# Patient Record
Sex: Female | Born: 1965 | Race: White | Hispanic: No | Marital: Married
Health system: Midwestern US, Academic
[De-identification: ages and names within clinical notes are randomized; demographics above are authoritative.]

---

## 2016-12-05 MED ORDER — SODIUM CHLORIDE 0.9 % IV SOLP
INTRAVENOUS | 0 refills | Status: CN
Start: 2016-12-05 — End: ?

## 2016-12-05 MED ORDER — PEG 3350-ELECTROLYTES-VIT C 100-7.5-2.691 GRAM PO PWPK
1 | ORAL | 0 refills | 1.00000 days | Status: DC
Start: 2016-12-05 — End: 2016-12-06

## 2016-12-06 DIAGNOSIS — K7460 Unspecified cirrhosis of liver: Secondary | ICD-10-CM

## 2016-12-06 MED ORDER — PEG-ELECTROLYTE SOLN 420 GRAM PO SOLR
4 L | Freq: Once | ORAL | 0 refills | Status: AC
Start: 2016-12-06 — End: ?

## 2016-12-09 MED ORDER — PEG 3350-ELECTROLYTES 236-22.74-6.74 -5.86 GRAM PO SOLR
0 refills | 1.00000 days | Status: DC
Start: 2016-12-09 — End: 2017-01-06

## 2016-12-24 MED ORDER — CHOLECALCIFEROL (VITAMIN D3) 50,000 UNIT PO CAP
50000 [IU] | ORAL_CAPSULE | ORAL | 2 refills | 84.00000 days | Status: DC
Start: 2016-12-24 — End: 2017-04-18

## 2017-01-03 ENCOUNTER — Encounter: Admit: 2017-01-03 | Discharge: 2017-01-03 | Payer: MEDICARE

## 2017-01-03 NOTE — Telephone Encounter
Emailed pre procedure instructions to pt.  LM for her to call back if she has any questions.

## 2017-01-03 NOTE — Telephone Encounter
Please put EGD/colon instructions into MyChart for patient.

## 2017-01-06 ENCOUNTER — Encounter: Admit: 2017-01-06 | Discharge: 2017-01-06 | Payer: MEDICARE

## 2017-01-06 MED ORDER — PEG 3350-ELECTROLYTES 236-22.74-6.74 -5.86 GRAM PO SOLR
0 refills | 1.00000 days | Status: AC
Start: 2017-01-06 — End: 2017-03-18

## 2017-01-06 NOTE — Progress Notes
Script for [employer] sent into [employer] on file.

## 2017-01-10 ENCOUNTER — Ambulatory Visit: Admit: 2017-01-10 | Discharge: 2017-01-10 | Payer: MEDICARE

## 2017-01-10 ENCOUNTER — Encounter: Admit: 2017-01-10 | Discharge: 2017-01-10 | Payer: MEDICARE

## 2017-01-10 DIAGNOSIS — D649 Anemia, unspecified: ICD-10-CM

## 2017-01-10 DIAGNOSIS — K210 Gastro-esophageal reflux disease with esophagitis: ICD-10-CM

## 2017-01-10 DIAGNOSIS — E079 Disorder of thyroid, unspecified: ICD-10-CM

## 2017-01-10 DIAGNOSIS — K317 Polyp of stomach and duodenum: ICD-10-CM

## 2017-01-10 DIAGNOSIS — F99 Mental disorder, not otherwise specified: ICD-10-CM

## 2017-01-10 DIAGNOSIS — K921 Melena: ICD-10-CM

## 2017-01-10 DIAGNOSIS — I8510 Secondary esophageal varices without bleeding: ICD-10-CM

## 2017-01-10 DIAGNOSIS — K766 Portal hypertension: ICD-10-CM

## 2017-01-10 DIAGNOSIS — K7460 Unspecified cirrhosis of liver: ICD-10-CM

## 2017-01-10 DIAGNOSIS — Z87891 Personal history of nicotine dependence: ICD-10-CM

## 2017-01-10 DIAGNOSIS — K644 Residual hemorrhoidal skin tags: ICD-10-CM

## 2017-01-10 DIAGNOSIS — K209 Esophagitis, unspecified: Principal | ICD-10-CM

## 2017-01-10 DIAGNOSIS — R1310 Dysphagia, unspecified: ICD-10-CM

## 2017-01-10 DIAGNOSIS — Z7984 Long term (current) use of oral hypoglycemic drugs: ICD-10-CM

## 2017-01-10 DIAGNOSIS — Z794 Long term (current) use of insulin: ICD-10-CM

## 2017-01-10 DIAGNOSIS — J45909 Unspecified asthma, uncomplicated: ICD-10-CM

## 2017-01-10 DIAGNOSIS — K3189 Other diseases of stomach and duodenum: ICD-10-CM

## 2017-01-10 DIAGNOSIS — D123 Benign neoplasm of transverse colon: Principal | ICD-10-CM

## 2017-01-10 MED ORDER — KETAMINE 10 MG/ML IJ SOLN
0 refills | Status: DC
Start: 2017-01-10 — End: 2017-01-10
  Administered 2017-01-10 (×3): 20 mg via INTRAVENOUS

## 2017-01-10 MED ORDER — LACTATED RINGERS IV SOLP
0 refills | Status: DC
Start: 2017-01-10 — End: 2017-01-10
  Administered 2017-01-10: 14:00:00 via INTRAVENOUS

## 2017-01-10 MED ORDER — PANTOPRAZOLE 40 MG PO TBEC
40 mg | ORAL_TABLET | Freq: Two times a day (BID) | ORAL | 3 refills | 90.00000 days | Status: DC
Start: 2017-01-10 — End: 2017-10-09

## 2017-01-10 MED ORDER — FENTANYL CITRATE (PF) 50 MCG/ML IJ SOLN
0 refills | Status: DC
Start: 2017-01-10 — End: 2017-01-10
  Administered 2017-01-10 (×5): 50 ug via INTRAVENOUS

## 2017-01-10 MED ORDER — MIDAZOLAM 1 MG/ML IJ SOLN
0 refills | Status: DC
Start: 2017-01-10 — End: 2017-01-10
  Administered 2017-01-10 (×2): 2 mg via INTRAVENOUS

## 2017-01-10 MED ORDER — LIDOCAINE (PF) 200 MG/10 ML (2 %) IJ SYRG
0 refills | Status: DC
Start: 2017-01-10 — End: 2017-01-10
  Administered 2017-01-10: 15:00:00 50 mg via INTRAVENOUS

## 2017-01-10 MED ORDER — INSULIN ASPART 100 UNIT/ML SC FLEXPEN
10 [IU] | Freq: Once | SUBCUTANEOUS | 0 refills | Status: CP
Start: 2017-01-10 — End: ?
  Administered 2017-01-10: 13:00:00 10 [IU] via SUBCUTANEOUS

## 2017-01-10 NOTE — Anesthesia Post-Procedure Evaluation
Post-Anesthesia Evaluation    Name: Nija Koopman      MRN: [id number]     DOB: October 16, 1965     Age: 51 y.o.     Sex: female   __________________________________________________________________________     Procedure Date: 01/10/2017  Procedure: Procedure(s):  ESOPHAGOGASTRODUODENOSCOPY  COLONOSCOPY  ESOPHAGOGASTRODUODENOSCOPY EXCISION LESION  COLONOSCOPY EXCISION LESION      Surgeon: Surgeon(s):  Dawna Part, MD    Post-Anesthesia Vitals  BP: (114-129)/(56-66)   Temp:  [37.1 ???C (98.8 ???F)]   Pulse:  [81-86]   Respirations:  [16 PER MINUTE]   SpO2:  [96 %-97 %]   O2 Delivery: None (Room Air) (06/08 1031)  SpO2 Pulse:  [80-84]        Post Anesthesia Evaluation Note    Evaluation location: Pre/Post  Patient participation: recovered; patient participated in evaluation  Level of consciousness: alert    Pain score: 0  Pain management: adequate    Hydration: normovolemia  Temperature: 36.0???C - 38.4???C  Airway patency: adequate    Perioperative Events  Perioperative events:  no       Post-op nausea and vomiting: no PONV    Postoperative Status  Cardiovascular status: hemodynamically stable  Respiratory status: spontaneous ventilation  Additional comments: Received [facility] records while patient in procedure.  Dicussed my interpretation with patient and husband.  It appears that ARF was multifactorial with rhabdomyolysis, overdiuresis with lasix and sepsis. Also had postoperative respiratory failure.  Propofol infusion syndrome was on the differential diagnosis for ARF, but patient was on it less than 1 day before it was discontinued and other causes certainly contributed to renal failure.  All questions answered.  Advised them that propofol is not necessarily an allergy and may not have even contributed to her postop course.  She is happy with endoscopic procedures under versed and fentanyl sedation and will consider whether she is willing to try propofol if needed in the future.  Records submitted to secretary for scanning into [facility] system.       Perioperative Events  Perioperative Event: No  Emergency Case Activation: No

## 2017-01-13 ENCOUNTER — Encounter: Admit: 2017-01-13 | Discharge: 2017-01-13 | Payer: MEDICARE

## 2017-01-13 DIAGNOSIS — E079 Disorder of thyroid, unspecified: ICD-10-CM

## 2017-01-13 DIAGNOSIS — J45909 Unspecified asthma, uncomplicated: Principal | ICD-10-CM

## 2017-01-13 DIAGNOSIS — F99 Mental disorder, not otherwise specified: ICD-10-CM

## 2017-01-14 DIAGNOSIS — I8500 Esophageal varices without bleeding: ICD-10-CM

## 2017-01-16 ENCOUNTER — Encounter: Admit: 2017-01-16 | Discharge: 2017-01-16 | Payer: MEDICARE

## 2017-01-16 DIAGNOSIS — K209 Esophagitis, unspecified: Principal | ICD-10-CM

## 2017-01-16 MED ORDER — SODIUM CHLORIDE 0.9 % IV SOLP
INTRAVENOUS | 0 refills | Status: CN
Start: 2017-01-16 — End: ?

## 2017-01-16 NOTE — Telephone Encounter
Reviewed EGD and colonoscopy biopsy results with pt.  Orders entered for EGD to be scheduled in 12 weeks per Dr. Tanna Furry recommendations. Informed her she will be due for colonoscopy in 5 years.  Pt verbalized understanding.

## 2017-01-27 ENCOUNTER — Encounter: Admit: 2017-01-27 | Discharge: 2017-01-27 | Payer: MEDICARE

## 2017-01-27 NOTE — Telephone Encounter
Returned call to pt.  She reports persistent nausea that seems to be better today but she did have nausea yesterday.   She currently alternates zofran and phenergan when she has these symptoms.  She also reports intermittent right upper quadrant pain.  She has recently been to the ER and pancreatitis has been ruled out.  She is looking for a cause of the pain and nausea.  Will discuss with Dr. Jeannine Kitten and return call to pt.

## 2017-01-27 NOTE — Telephone Encounter
Pt lvm requesting to speak with NC regarding severe nausea.

## 2017-01-28 NOTE — Telephone Encounter
Dr. Jeannine Kitten called pt and spoke with her about her symptoms.  Her diabetes is not well controlled at this time.  She needs to work on lowering blood sugars.  No changes were made.

## 2017-01-28 NOTE — Telephone Encounter
Call from husband stating the Alexis Mcconnell is still suffering from persistent nausea.  Please return his call.

## 2017-02-10 ENCOUNTER — Encounter: Admit: 2017-02-10 | Discharge: 2017-02-10 | Payer: MEDICARE

## 2017-02-10 NOTE — Telephone Encounter
Please return a call to [facility] regarding dates needed for some upcoming tests.  Wants to discuss with you.

## 2017-02-10 NOTE — Telephone Encounter
Returned call to pt. No answer LM for her to call back.

## 2017-02-11 NOTE — Telephone Encounter
Spoke with pt today.  She was wanting to know when GI would call her to schedule follow up EGD.  Informed her that they should be calling soon to schedule that appointment.  She also inquired about a date for her ultrasound.  Informed her that she is not due until September for abdominal imaging.  We will send the order to [facility] to be completed there at that time.  Pt verbalized understanding.

## 2017-02-12 NOTE — Telephone Encounter
Pt PCP called informing us that pt recent potassium was 6.2.  They have instructed pt to hold spironolactone for 1 week and will recheck in 1 week.  They will send [place] the results.

## 2017-03-07 ENCOUNTER — Encounter: Admit: 2017-03-07 | Discharge: 2017-03-07 | Payer: MEDICARE

## 2017-03-07 DIAGNOSIS — E875 Hyperkalemia: Principal | ICD-10-CM

## 2017-03-07 NOTE — Telephone Encounter
CMP order faxed to PCP office to recheck potassium level.

## 2017-03-07 NOTE — Telephone Encounter
Please fax new lab orders to Dr. Barbera Setters office.

## 2017-03-14 ENCOUNTER — Encounter: Admit: 2017-03-14 | Discharge: 2017-03-14 | Payer: MEDICARE

## 2017-03-14 DIAGNOSIS — E875 Hyperkalemia: Principal | ICD-10-CM

## 2017-03-14 LAB — COMPREHENSIVE METABOLIC PANEL
Lab: 0.5 % — ABNORMAL HIGH (ref 11–15)
Lab: 1.2 10*3/uL — ABNORMAL HIGH (ref 1.0–4.8)
Lab: 10 10*3/uL (ref >60)
Lab: 102 % — ABNORMAL LOW (ref 24–44)
Lab: 118 % — ABNORMAL HIGH (ref >60)
Lab: 13
Lab: 13 10*3/uL — ABNORMAL LOW (ref 0–0.20)
Lab: 140 FL (ref 7–11)
Lab: 2.8 pg — ABNORMAL LOW (ref 26–34)
Lab: 223 10*3/uL — ABNORMAL HIGH (ref 150–400)
Lab: 3.3 % — ABNORMAL LOW (ref 41–77)
Lab: 31 % — ABNORMAL HIGH (ref 4–12)
Lab: 47 10*3/uL — ABNORMAL LOW (ref 0–0.80)
Lab: 57 10*3/uL — ABNORMAL LOW (ref 0–0.45)
Lab: 6.3 FL — ABNORMAL LOW (ref 80–100)
Lab: 7 % (ref 0–5)
Lab: 8.1 g/dL — ABNORMAL LOW (ref 32.0–36.0)

## 2017-03-17 ENCOUNTER — Encounter: Admit: 2017-03-17 | Discharge: 2017-03-17 | Payer: MEDICARE

## 2017-03-17 LAB — URINALYSIS DIPSTICK
Lab: NEGATIVE
Lab: NEGATIVE
Lab: NEGATIVE
Lab: NEGATIVE
Lab: NEGATIVE
Lab: NEGATIVE
Lab: NEGATIVE
Lab: NEGATIVE

## 2017-03-17 LAB — URINALYSIS, MICROSCOPIC

## 2017-03-17 MED ORDER — PANTOPRAZOLE 40 MG IV SOLR
40 mg | Freq: Once | INTRAVENOUS | 0 refills | Status: CP
Start: 2017-03-17 — End: ?
  Administered 2017-03-18: 04:00:00 40 mg via INTRAVENOUS

## 2017-03-17 MED ORDER — PANTOPRAZOLE 40 MG IV SOLR
40 mg | Freq: Every day | INTRAVENOUS | 0 refills | Status: DC
Start: 2017-03-17 — End: 2017-03-18

## 2017-03-17 MED ORDER — ONDANSETRON HCL (PF) 4 MG/2 ML IJ SOLN
4 mg | Freq: Once | INTRAVENOUS | 0 refills | Status: CP
Start: 2017-03-17 — End: ?
  Administered 2017-03-18: 04:00:00 4 mg via INTRAVENOUS

## 2017-03-17 MED ORDER — OCTREOTIDE IV DRIP
50 ug/h | INTRAVENOUS | 0 refills | Status: DC
Start: 2017-03-17 — End: 2017-03-18
  Administered 2017-03-18 (×2): 50 ug/h via INTRAVENOUS

## 2017-03-17 MED ORDER — FENTANYL CITRATE (PF) 50 MCG/ML IJ SOLN
50 ug | Freq: Once | INTRAVENOUS | 0 refills | Status: CP
Start: 2017-03-17 — End: ?
  Administered 2017-03-18: 04:00:00 50 ug via INTRAVENOUS

## 2017-03-17 MED ORDER — CEFTRIAXONE INJ 2GM IVP
2 g | Freq: Once | INTRAVENOUS | 0 refills | Status: CP
Start: 2017-03-17 — End: ?
  Administered 2017-03-18: 05:00:00 2 g via INTRAVENOUS

## 2017-03-17 MED ORDER — OCTREOTIDE (SANDOSTATIN) BOLUS FOR CONTINUOUS INFUSION
50 ug | Freq: Once | INTRAVENOUS | 0 refills | Status: CP
Start: 2017-03-17 — End: ?

## 2017-03-17 NOTE — Telephone Encounter
Dr. Jeannine Kitten communicating with pt regarding this issue.

## 2017-03-17 NOTE — Telephone Encounter
Pt lvm to report rectal bleeding. Please advise

## 2017-03-18 ENCOUNTER — Encounter: Admit: 2017-03-18 | Discharge: 2017-03-18 | Payer: MEDICARE

## 2017-03-18 ENCOUNTER — Emergency Department: Admit: 2017-03-18 | Discharge: 2017-03-18 | Payer: MEDICARE

## 2017-03-18 DIAGNOSIS — J45909 Unspecified asthma, uncomplicated: Principal | ICD-10-CM

## 2017-03-18 DIAGNOSIS — K7460 Unspecified cirrhosis of liver: ICD-10-CM

## 2017-03-18 DIAGNOSIS — F99 Mental disorder, not otherwise specified: ICD-10-CM

## 2017-03-18 DIAGNOSIS — E079 Disorder of thyroid, unspecified: ICD-10-CM

## 2017-03-18 LAB — COMPREHENSIVE METABOLIC PANEL
Lab: 0.6 mg/dL (ref 0.3–1.2)
Lab: 1.3 mg/dL — ABNORMAL HIGH (ref 0.4–1.00)
Lab: 12 mg/dL (ref 7–25)
Lab: 142 MMOL/L — ABNORMAL LOW (ref 137–147)
Lab: 146 mg/dL — ABNORMAL HIGH (ref 70–100)
Lab: 17 U/L (ref 7–40)
Lab: 197 U/L — ABNORMAL HIGH (ref 25–110)
Lab: 3.4 g/dL — ABNORMAL LOW (ref 3.5–5.0)
Lab: 3.6 MMOL/L — ABNORMAL LOW (ref 3.5–5.1)
Lab: 30 MMOL/L (ref 21–30)
Lab: 6.4 g/dL (ref 6.0–8.0)
Lab: 8 U/L (ref 7–56)
Lab: 8.8 mg/dL — ABNORMAL LOW (ref 8.5–10.6)

## 2017-03-18 LAB — BASIC METABOLIC PANEL
Lab: 1.2 mg/dL — ABNORMAL HIGH (ref 0.4–1.00)
Lab: 10 pg (ref 3–12)
Lab: 102 MMOL/L — ABNORMAL LOW (ref 98–110)
Lab: 11 mg/dL — ABNORMAL HIGH (ref 7–25)
Lab: 141 MMOL/L — ABNORMAL LOW (ref 137–147)
Lab: 159 mg/dL — ABNORMAL HIGH (ref 70–100)
Lab: 29 MMOL/L (ref 21–30)
Lab: 3.9 MMOL/L — ABNORMAL LOW (ref 3.5–5.1)
Lab: 44 mL/min — ABNORMAL LOW (ref >60)
Lab: 53 mL/min — ABNORMAL LOW (ref >60)
Lab: 8.6 mg/dL (ref 8.5–10.6)

## 2017-03-18 LAB — CBC AND DIFF: Lab: 7.9 10*3/uL (ref 4.5–11.0)

## 2017-03-18 LAB — CBC
Lab: 8.1 10*3/uL (ref 4.5–11.0)
Lab: 2.6 M/UL — ABNORMAL LOW (ref 4.0–5.0)
Lab: 7.9 10*3/uL (ref 4.5–11.0)
Lab: 8.1 g/dL — ABNORMAL LOW (ref 12.0–15.0)

## 2017-03-18 LAB — POC LACTATE: Lab: 1.4 MMOL/L (ref 0.5–2.0)

## 2017-03-18 LAB — POC GLUCOSE
Lab: 192 mg/dL — ABNORMAL HIGH (ref 70–100)
Lab: 257 mg/dL — ABNORMAL HIGH (ref 70–100)
Lab: 314 mg/dL — ABNORMAL HIGH (ref 70–100)
Lab: 286 mg/dL — ABNORMAL HIGH (ref 70–100)

## 2017-03-18 LAB — LIPASE: Lab: <3 U/L — ABNORMAL LOW (ref 11–82)

## 2017-03-18 LAB — PROTIME INR (PT): Lab: 1.6 M/UL — ABNORMAL HIGH (ref 0.8–1.2)

## 2017-03-18 MED ORDER — FUROSEMIDE 40 MG PO TAB
40 mg | Freq: Two times a day (BID) | ORAL | 0 refills | Status: DC
Start: 2017-03-18 — End: 2017-03-18
  Administered 2017-03-18: 22:00:00 40 mg via ORAL

## 2017-03-18 MED ORDER — INSULIN GLARGINE 100 UNIT/ML (3 ML) SC INJ PEN
50 [IU] | Freq: Every evening | SUBCUTANEOUS | 0 refills | Status: DC
Start: 2017-03-18 — End: 2017-03-21
  Administered 2017-03-19: 03:00:00 50 [IU] via SUBCUTANEOUS

## 2017-03-18 MED ORDER — ACETAMINOPHEN 325 MG PO TAB
325 mg | ORAL | 0 refills | Status: DC | PRN
Start: 2017-03-18 — End: 2017-03-21
  Administered 2017-03-18 – 2017-03-19 (×4): 325 mg via ORAL

## 2017-03-18 MED ORDER — HEMORRHOIDAL SUPP (PHENYLEPHRINE 0.25 %)
1 | Freq: Every day | RECTAL | 0 refills | Status: DC
Start: 2017-03-18 — End: 2017-03-18
  Administered 2017-03-18: 23:00:00 1 via RECTAL

## 2017-03-18 MED ORDER — INSULIN ASPART 100 UNIT/ML SC FLEXPEN
0-28 [IU] | Freq: Before meals | SUBCUTANEOUS | 0 refills | Status: DC
Start: 2017-03-18 — End: 2017-03-21

## 2017-03-18 MED ORDER — VITAMIN A 10,000 UNIT PO CAP
10000 [IU] | Freq: Every day | ORAL | 0 refills | Status: DC
Start: 2017-03-18 — End: 2017-03-21
  Administered 2017-03-18 – 2017-03-20 (×3): 10000 [IU] via ORAL

## 2017-03-18 MED ORDER — METOCLOPRAMIDE HCL 10 MG PO TAB
10 mg | Freq: Two times a day (BID) | ORAL | 0 refills | Status: DC
Start: 2017-03-18 — End: 2017-03-21
  Administered 2017-03-18 – 2017-03-20 (×4): 10 mg via ORAL

## 2017-03-18 MED ORDER — PANTOPRAZOLE 40 MG PO TBEC
40 mg | Freq: Two times a day (BID) | ORAL | 0 refills | Status: DC
Start: 2017-03-18 — End: 2017-03-21
  Administered 2017-03-18 – 2017-03-20 (×5): 40 mg via ORAL

## 2017-03-18 MED ORDER — GEMFIBROZIL 600 MG PO TAB
600 mg | Freq: Two times a day (BID) | ORAL | 0 refills | Status: DC
Start: 2017-03-18 — End: 2017-03-21
  Administered 2017-03-18 – 2017-03-20 (×5): 600 mg via ORAL

## 2017-03-18 MED ORDER — GABAPENTIN 300 MG PO CAP
600 mg | Freq: Three times a day (TID) | ORAL | 0 refills | Status: DC
Start: 2017-03-18 — End: 2017-03-21
  Administered 2017-03-18 – 2017-03-20 (×7): 600 mg via ORAL

## 2017-03-18 MED ORDER — INSULIN ASPART 100 UNIT/ML SC FLEXPEN
5 [IU] | Freq: Three times a day (TID) | SUBCUTANEOUS | 0 refills | Status: DC
Start: 2017-03-18 — End: 2017-03-18
  Administered 2017-03-18: 15:00:00 5 [IU] via SUBCUTANEOUS

## 2017-03-18 MED ORDER — FLUOXETINE 20 MG PO CAP
40 mg | Freq: Every day | ORAL | 0 refills | Status: DC
Start: 2017-03-18 — End: 2017-03-21
  Administered 2017-03-18 – 2017-03-20 (×3): 40 mg via ORAL

## 2017-03-18 MED ORDER — LEVOTHYROXINE 200 MCG PO TAB
200 ug | Freq: Every day | ORAL | 0 refills | Status: DC
Start: 2017-03-18 — End: 2017-03-18

## 2017-03-18 MED ORDER — ONDANSETRON HCL (PF) 4 MG/2 ML IJ SOLN
4 mg | Freq: Once | INTRAVENOUS | 0 refills | Status: CP
Start: 2017-03-18 — End: ?
  Administered 2017-03-18: 07:00:00 4 mg via INTRAVENOUS

## 2017-03-18 MED ORDER — FUROSEMIDE 10 MG/ML IJ SOLN
40 mg | INTRAVENOUS | 0 refills | Status: DC
Start: 2017-03-18 — End: 2017-03-19
  Administered 2017-03-19 (×3): 40 mg via INTRAVENOUS

## 2017-03-18 MED ORDER — VALACYCLOVIR 500 MG PO TAB
500 mg | Freq: Every day | ORAL | 0 refills | Status: DC
Start: 2017-03-18 — End: 2017-03-21
  Administered 2017-03-18 – 2017-03-20 (×3): 500 mg via ORAL

## 2017-03-18 MED ORDER — ONDANSETRON HCL 4 MG PO TAB
4 mg | ORAL | 0 refills | Status: DC | PRN
Start: 2017-03-18 — End: 2017-03-21
  Administered 2017-03-18 – 2017-03-20 (×3): 4 mg via ORAL

## 2017-03-18 MED ORDER — LACTULOSE 10 GRAM/15 ML PO SOLN
30 g | Freq: Four times a day (QID) | ORAL | 0 refills | Status: DC
Start: 2017-03-18 — End: 2017-03-21
  Administered 2017-03-18 – 2017-03-20 (×7): 30 g via ORAL

## 2017-03-18 MED ORDER — OXYCODONE 5 MG PO TAB
5 mg | ORAL | 0 refills | Status: DC | PRN
Start: 2017-03-18 — End: 2017-03-21
  Administered 2017-03-18 – 2017-03-20 (×8): 5 mg via ORAL

## 2017-03-18 MED ORDER — FUROSEMIDE 10 MG/ML IJ SOLN
40 mg | Freq: Once | INTRAVENOUS | 0 refills | Status: CP
Start: 2017-03-18 — End: ?
  Administered 2017-03-18: 10:00:00 40 mg via INTRAVENOUS

## 2017-03-18 MED ORDER — RIFAXIMIN 550 MG PO TAB
550 mg | Freq: Two times a day (BID) | ORAL | 0 refills | Status: DC
Start: 2017-03-18 — End: 2017-03-21
  Administered 2017-03-18 – 2017-03-20 (×5): 550 mg via ORAL

## 2017-03-18 MED ORDER — FUROSEMIDE 10 MG/ML IJ SOLN
20 mg | Freq: Once | INTRAVENOUS | 0 refills | Status: CP
Start: 2017-03-18 — End: ?
  Administered 2017-03-19: 20 mg via INTRAVENOUS

## 2017-03-18 MED ORDER — INSULIN ASPART 100 UNIT/ML SC FLEXPEN
0-7 [IU] | Freq: Before meals | SUBCUTANEOUS | 0 refills | Status: DC
Start: 2017-03-18 — End: 2017-03-18

## 2017-03-18 MED ORDER — SPIRONOLACTONE 100 MG PO TAB
100 mg | Freq: Every day | ORAL | 0 refills | Status: DC
Start: 2017-03-18 — End: 2017-03-21
  Administered 2017-03-19 – 2017-03-20 (×3): 100 mg via ORAL

## 2017-03-18 MED ORDER — HEMORRHOIDAL SUPP (PHENYLEPHRINE 0.25 %)
1 | Freq: Two times a day (BID) | RECTAL | 0 refills | Status: DC
Start: 2017-03-18 — End: 2017-03-21
  Administered 2017-03-19 – 2017-03-20 (×3): 1 via RECTAL

## 2017-03-18 MED ORDER — IMS MIXTURE TEMPLATE
175 ug | Freq: Every day | ORAL | 0 refills | Status: DC
Start: 2017-03-18 — End: 2017-03-21
  Administered 2017-03-19 – 2017-03-20 (×4): 175 ug via ORAL

## 2017-03-18 NOTE — ED Notes
Pt presents to the ED via POV w/ c/o rectal bleeding x Saturday around 2pm. Pt reports bright red blood per rectum. Pt denies recent constipation. 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 Pt reports hx of hemorrhoids but denies any at this time. Pt does endorse L sided abdominal pain and nausea. Pt reports low grade fevers x months. Pt is AO x 4. Airway intact, breathing non-labored, pulses palpable. Pt placed on monitor. Bed in low position, side-rails raised, call light in reach.     Belongings: Pt reports SO at bedside responsible for all belongings.

## 2017-03-18 NOTE — Case Management (ED)
Case Management Admission Assessment    NAME:Alexis Mcconnell                          MRN: [id number]             DOB:[birth year]          AGE: 51 y.o.  ADMISSION DATE: 03/17/2017             DAYS ADMITTED: LOS: 0 days      Today???s Date: 03/18/2017    Source of Information: Spoke with pt and with her permission her husband, Sam who is present tin the room,  concerning needs and care.  Explained role and purpose of NCM/SW in pt's care. Pt/husband  relate understanding and agreement.      Plan  Plan: CM Assessment, Assist PRN with SW/NCM Services, Discharge Planning for Home Anticipated    Patient Address/Phone  [address]  [place] [address]  [phone] (home)     Emergency Contact  Extended Emergency Contact Information  Primary Emergency Contact: Adeleine, Gerads States  Home Phone: [phone]  Relation: Spouse  Secondary Emergency Contact: Ebony Cargo [place]  Home Phone: [phone]  Relation: Mother    Healthcare Directive  Healthcare Directive: No, patient does not have a healthcare directive  Would patient like to fill out a  new) Healthcare Directive?: No, patient declined      Transportation  Does the patient need discharge transport arranged?: No  Transportation Name, Phone and Availability #1: husband can provide    Expected Discharge Date  Expected Discharge Date: 03/20/17    Living Situation Prior to Admission  ? Living Arrangements  Type of Residence: Home, with Home Health or other assistance  Living Arrangements: Spouse/significant other  Can patient live on one level if needed?:  (has 1 step to bathroom)  Does residence have entry and/or side stairs?: Yes (6)  Assistance needed prior to admit or anticipated on discharge: Yes  Who provides assistance or could if needed?: husband helps with bathing  ? Level of Function   Prior level of function: Needs assist with ADLs  Which ADLs require assistance?: bathing   ? Cognitive Abilities Cognitive Abilities: Alert and Oriented, Engages in problem solving and planning, Participates in decision making    Financial Resources  ? Coverage  Primary Insurance: Medicare  Secondary Insurance: Medicare Supplement  Additional Coverage: RX (not able to afford some medications- has co pay assistance for Rifaximin. not able to afford Linzess and doesn't have.-- years ago wasn't able to pay the $20 for Wellbutrin. )    ? Source of Income   Source Of Income: SSDI  ? Financial Assistance Needed?  na    Psychosocial Needs  ? Mental Health  Mental Health History: Yes  Agency name: depression- takes Prozac  Mental Health Provider: had an appointment to see a biblical counselor in [place] today   ? Substance Use History  Substance Use History Screen: No  ? Other  ? na  Current/Previous Services  ? PCP  [facility], [phone], [phone]  ? Pharmacy    [facility] [address], [place] - [address] [facility]  [address] [place] [address]  Phone: [phone] Fax: [phone]    ? Durable Medical Equipment   Durable Medical Equipment at home: Wheelchair (manual), CPAP- has at hospital, from [place], Rollator, 2 different canes one large and one small, usually  uses a cane.  Shower chair, walker    ? Home Health  Receiving home health: In the past  Agency name: [facility]   Would patient use this agency again?: No    ? Hemodialysis or Peritoneal Dialysis     ? Tube/Enteral Feeds     ? Infusion     ? Private Duty     ? Home and [employer]     ? [facility]     ? Hospice     ? Outpatient Therapy     ? Skilled Nursing Facility/Nursing Home  SNF: In the past  Name of Facility: [facility] on [address] in [place]. Jomarie Longs  Would patient return for future services?: No    ? Inpatient Rehab  IPR: No  ? Long-Term Acute Care Hospital  LTACH: In the past  When did patient receive care?: 2010  Name of Facility: [facility]   ? Acute Hospital Stay  Acute Hospital Stay: In the past Was patient's stay within the last 30 days?: No    L Romonda Parker, RN MSN ONC  Nurse Case Manager  Pg [phone]  X- [phone]

## 2017-03-18 NOTE — Progress Notes
Patient arrived to room # [id number]*) via bed accompanied by transport. Patient transferred to the bed with assistance. Bedside safety checks completed. Initial patient assessment completed, refer to flowsheet for details. Admission skin assessment completed by: Daisy Blossom and [place] Prude     Pressure Injury Present on Hospital Admission (within 24 hours): No    1. Occiput: No  2. Ear: No  3. Scapula: No  4. Spinous Process: No  5. Shoulder: No  6. Elbow: No  7. Iliac Crest: No  8. Sacrum/Coccyx: No  9. Ischial Tuberosity: No  10. Trochanter: No  11. Knee: No  12. Malleolus: No  13. Heel: No  14. Toes: No  15. Assessed for device associated injury Yes  16. Nursing Nutrition Assessment Completed Yes    See Doc Flowsheet for additional wound details.     INTERVENTIONS:

## 2017-03-18 NOTE — ED Notes
[id number] @ 0237. Please call Mendel Ryder, RN at [phone].

## 2017-03-18 NOTE — Progress Notes
I have reviewed the notes, assessment, and/or procedures performed by Young Hyun, RN and concur with her/his documentation unless otherwise noted.

## 2017-03-18 NOTE — Care Coordination-Inpatient
From N4.

## 2017-03-18 NOTE — Progress Notes
Patient allergy band in place.Pt A&Ox4. Pt ID band verified with patient.  Profile completed, Fall risk in place, care plan/education updated,  call light within reach

## 2017-03-18 NOTE — Progress Notes
Reason for Visit: Patient request    Faith/Religion: [employer]    Worries/Concerns/Struggles: Patient asked which church chaplain attended, then stated she was [employer] [facility] and her pastor is too far away to visit. Patient stated her frustration with communication with one of the doctors. Patient stated she was told one thing in the ED and another at bedside in this unit. Patient stated spouse is exhausted (two hours sleep) and is remaining at patient's bedside until patient receives a consult from liver specialist. Patient requested prayer for this situation.    Support System:  Spouse    Interventions/Plan: Chaplain provided reflective and compassionate listening. Chaplain encouraged patient and prayed for her health and communication with medical staff. Chaplain offered to return for follow-up tomorrow afternoon. Patient agreed that it would be good to have another visit from a chaplain. This chaplain assured patient that chaplains are available 24/7 and encouraged patient to reach out to Spiritual Care if anything else came up.    The spiritual care team is available as needed, 24/7, through the campus switchboard [phone]).  For immediate response, please page [phone].  For a response within 24 hours, please submit an order in [facility] for a chaplain consult or call the administrative voicemail at [phone].

## 2017-03-18 NOTE — Consults
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    1.  Hematochezia:  Reports several episodes of liquid, bright red blood on toilet paper and sounding like faucet when sitting on toilet.  Had colonoscopy recently that showed external hemorrhoids.  Likely hemorrhoidal bleed.  Drop in hgb is likely related to volume overload as all cell lines are down.  Rectal exam today revealed scant red blood in rectal vault.  There was some boggy fullness felt that was concerning for hemorrhoids.  Volume overload may also increase risk for hemorrhoidal bleeding  - monitor H/H  - monitor for signs of overt GI bleeding   - anusol suppository BID x 7-10 days    2.  Volume overload:  Previously weighed 260 lbs in 01/2017.  Now up to 330 lbs.  Has had issues with hyperkalemia as outpt on aldactone.  There is marked body wall edema on CT exam and physical reveals taut lower extremity edema.  - IV lasix 40 mg TID and aldactone 100 mg daily  - goal for net negative 2-3 liters out daily  - monitor ins/outs  - daily weights  - low sodium diet  - monitor electrolytes  - monitor renal function    3.  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  Follows with Dr. Constance Goltz:  MELD-Na score: 14 at 03/18/2017  9:11 AM  MELD score: 14 at 03/18/2017  9:11 AM  Calculated from:  Serum Creatinine: 1.29 MG/DL at 1/47/8295  6:21 AM  Serum Sodium: 141 MMOL/L (Rounded to 137) at 03/18/2017  9:11 AM  Total Bilirubin: 0.6 MG/DL (Rounded to 1) at 10/10/6576 10:30 PM  INR(ratio): 1.6 at 03/17/2017 10:30 PM  Age: 18 years    Disposition: 1. hepatology clinic follow up with - Dr. Constance Goltz  2. need rifaximin on discharge - no  3. follow up appointment - no  4. teaching/diet - low sodium  5. labs - bmp 1 week after discharge  6. procedures - none   Future Appointments  Date Time Provider Department Center   04/10/2017 2:00 PM Rodena Piety, MD [facility] [place]       Thank you for this consult and allowing [place] to participate in care of this patient.     Please page or call if questions.     Patient was seen and discussed with Dr. Casper Harrison.    Lawana Pai, MD  03/18/2017 6:12 PM   Gastroenterology and Hepatology Fellow  Pager: [phone]  ______________________________________________________________________    History of Present Illness:  Alexis Mcconnell is a 51 y.o. female who presents with reported hematochezia.  States she has been having bright red blood per rectum since 03/15/17.  Has had about 10 episodes.  States she is passing blood even when she just sits on toilet.  Has been using witchhazel  for hemorrhoids, but has not helped.  Reports some lightheadedness.  No Loss of consciousness.  No hematemesis or melena.  Has had some nausea without vomiting.  Denies NSAID use.  No anticoagulation.  Last colonoscopy in 01/2017 with hemorrhoids at that time.      PMH:  Past Medical History:   Diagnosis Date   ??? Asthma    ??? Disorder of thyroid gland    ??? Liver cirrhosis (HCC)     diagnosed in [place]   ??? Psychiatric illness        Social History:  Social History     Social History   ??? Marital status: Married     Spouse name: N/A   ??? Number of children: N/A   ??? Years of education: N/A     Occupational History   ??? Not on file.     Social History Main Topics   ??? Smoking status: Former Smoker     Quit date: 03/18/1998   ??? Smokeless tobacco: Never Used   ??? Alcohol use No   ??? Drug use: No   ??? Sexual activity: Not Currently     Other Topics Concern   ??? Not on file     Social History Narrative   ??? No narrative on file       Family History: History reviewed. No pertinent family history.    Allergies:    Allergies   Allergen Reactions   ??? Citrus Fruit [Citrus And Derivatives] HEADACHE   ??? 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 Levaquin [Levofloxacin] RASH   ??? Sulfa (Sulfonamide Antibiotics) RASH   ??? Citric Acid HEADACHE   ??? Lovenox [Enoxaparin] AGITATION   ??? Propofol SEE COMMENTS     Per [facility] 2010 records, postoperative sepsis and acute renal failure necessitating dialysis likely caused by lasix and hypoperfusion, rhabdomyolysis.  Differential diagnosis included propofol infusion syndrome.        Admission Medications:  Scheduled Meds:  fluoxetine (PROZAC) capsule 40 mg 40 mg Oral QDAY   furosemide (LASIX) tablet 40 mg 40 mg Oral BID(9-17)   gabapentin (NEURONTIN) capsule 600 mg 600 mg Oral TID   gemfibrozil (LOPID) tablet 600 mg 600 mg Oral BID   hemorrhoidal prep (ANUSOL) rectal suppository 1 suppository 1 suppository Rectal QDAY   insulin aspart U-100 (NOVOLOG FLEXPEN) injection PEN 0-7 Units 0-7 Units Subcutaneous ACHS   insulin aspart U-100 (NOVOLOG FLEXPEN) injection PEN 5 Units 5 Units Subcutaneous TID w/ meals   insulin glargine (LANTUS SOLOSTAR, BASAGLAR) injection PEN 50 Units 50 Units Subcutaneous QHS   lactulose oral solution 30 g 30 g Oral QID   [START ON 03/19/2017] levothyroxine (SYNTHROID) tablet 175 mcg 175 mcg Oral QDAY   metoclopramide (REGLAN) tablet 10 mg 10 mg Oral BID before meals   pantoprazole DR (PROTONIX) tablet 40 mg 40 mg Oral BID   rifAXIMin (XIFAXAN) tablet 550 mg 550 mg Oral BID   valACYclovir (VALTREX) tablet 500 mg 500 mg Oral QDAY   vitamin A capsule 10,000 Units 10,000 Units Oral QDAY   Continuous Infusions:  PRN and Respiratory Meds:acetaminophen Q6H PRN, ondansetron Q6H PRN, oxyCODONE Q4H PRN      Prior to admission Medications:  Prescriptions Prior to Admission   Medication Sig Dispense Refill Last Dose ??? acetaminophen (TYLENOL) 325 mg tablet Take 650 mg by mouth every 4 hours as needed for Pain.   03/15/2017   ??? albuterol (PROAIR HFA,  VENTOLIN HFA, OR PROVENTIL HFA) 90 mcg/actuation inhaler Inhale 2 puffs by mouth into the lungs every 6 hours as needed for Wheezing or Shortness of Breath. Shake well before use.   >1 Month   ??? cholecalciferol(+) (Vitamin D3) 50,000 units capsule Take 1 capsule by mouth three times weekly. 12 capsule 2 03/17/2017   ??? clonazePAM (KLONOPIN) 0.5 mg tablet Take 0.5 mg by mouth twice daily.   Past Month   ??? ferrous sulfate (FEOSOL, FEROSUL) 325 mg (65 mg iron) tablet Take 325 mg by mouth three times daily. Take on an empty stomach at least 1 hour before or 2 hours after food.   03/16/2017   ??? fluoxetine(+) (PROZAC) 40 mg capsule Take 40 mg by mouth daily.   03/17/2017   ??? furosemide (LASIX) 20 mg tablet Take 20mg  by mouth in the morning and take 40mg  in the evening.   03/17/2017   ??? gabapentin (NEURONTIN) 800 mg tablet Take 800 mg by mouth three times daily.   03/17/2017   ??? gemfibrozil (LOPID) 600 mg tablet Take 600 mg by mouth twice daily.   03/17/2017   ??? hydrOXYzine pamoate (VISTARIL) 25 mg capsule Take 25 mg by mouth three times daily as needed for Itching.   03/14/2017   ??? insulin aspart U-100 (NOVOLOG FLEXPEN U-100 INSULIN) 100 unit/mL injection PEN Inject 30 Units under the skin three times daily with meals.   03/17/2017   ??? insulin detemir (LEVEMIR FLEXPEN SC) Inject 50 Units under the skin twice daily.   03/17/2017   ??? lactulose 10 gram/15 mL oral solution Take 30 g by mouth four times daily.   03/17/2017   ??? levothyroxine (SYNTHROID) 175 mcg tablet Take 175 mcg by mouth daily 30 minutes before breakfast.   03/17/2017   ??? magnesium oxide (MAG-OX) 400 mg tablet Take 400 mg by mouth twice daily.   03/17/2017   ??? Menthol (ASPERCREME HEAT) 10 % gel Apply  topically to affected area.   03/16/2017   ??? metoclopramide (REGLAN) 10 mg tablet Take 10 mg by mouth twice daily.   03/17/2017 ??? multivit-iron-FA-calcium-mins (ONE-A-DAY WOMENS FORMULA) 18 mg iron-400 mcg-500 mg Ca tab Take 1 tablet by mouth daily.   03/16/2017   ??? ondansetron (ZOFRAN) 8 mg tablet Take 8 mg by mouth three times daily as needed.   03/17/2017   ??? oxyCODONE (ROXICODONE, OXY-IR) 5 mg tablet Take 5 mg by mouth daily    03/17/2017   ??? pantoprazole DR (PROTONIX) 40 mg tablet Take 1 tablet by mouth twice daily. 90 tablet 3 03/17/2017   ??? promethazine (PHENERGAN) 25 mg tablet Take 25 mg by mouth every 6 hours as needed for Nausea or Vomiting.   03/17/2017   ??? pyridoxine (vitamin B6) (VITAMIN B-6) 100 mg tablet Take 100 mg by mouth daily.   03/17/2017   ??? rifAXIMin (XIFAXAN) 550 mg tablet Take 550 mg by mouth twice daily.   03/17/2017   ??? sodium chloride (SALINE MIST) 0.65 % nasal spray Apply 1-2 sprays to each nostril as directed as Needed.   03/17/2017   ??? valACYclovir (VALTREX) 500 mg tablet Take 500 mg by mouth every 24 hours.   03/17/2017   ??? vitamin A 10,000 unit capsule Take 10,000 Units by mouth daily.   03/17/2017       Review of Systems:  A comprehensive review of systems was negative except per HPI    Physical Exam:  Vitals:    03/18/17 0745 03/18/17 1118 03/18/17 1517 03/18/17  1758   BP: 114/59 124/58 103/53    Pulse: 57 66 54    Temp: 36.6 ???C (97.9 ???F) 36.8 ???C (98.2 ???F) 36.9 ???C (98.5 ???F)    SpO2: 99% 96% 97%    Weight:    (!) 147.9 kg (326 lb 0.6 oz)   Height:           Body mass index is 61.6 kg/m???.    Gen: No acute distress, alert, cooperative, appears stated age  H/N: Normocephalic, atraumatic, anicteric  Heart: RRR, no murmurs, rubs or gallops  Lungs: CTAB, no wheezes or rhonchi  Abd: Morbidly obese, body wall edema  Ext: 3+ bilateral lower extremity edema  Vasc: 2+ radial pulses  Neuro: AOx4, no focal deficits, no asterixis  Skin: No jaundice, no visible rashes  Psych: Normal affect     Labs:  24-hour labs:    Results for orders placed or performed during the hospital encounter of 03/17/17 (from the past 24 hour(s)) CBC AND DIFF    Collection Time: 03/17/17 10:30 PM   Result Value Ref Range    White Blood Cells 7.9 4.5 - 11.0 K/UL    RBC 2.77 (L) 4.0 - 5.0 M/UL    Hemoglobin 8.6 (L) 12.0 - 15.0 GM/DL    Hematocrit 04.5 (L) 36 - 45 %    MCV 94.3 80 - 100 FL    MCH 31.2 26 - 34 PG    MCHC 33.1 32.0 - 36.0 G/DL    RDW 40.9 (H) 11 - 15 %    Platelet Count 119 (L) 150 - 400 K/UL    MPV 8.5 7 - 11 FL    Neutrophils 68 41 - 77 %    Lymphocytes 25 24 - 44 %    Monocytes 4 4 - 12 %    Eosinophils 3 0 - 5 %    Basophils 0 0 - 2 %    Absolute Neutrophil Count 5.40 1.8 - 7.0 K/UL    Absolute Lymph Count 2.00 1.0 - 4.8 K/UL    Absolute Monocyte Count 0.30 0 - 0.80 K/UL    Absolute Eosinophil Count 0.30 0 - 0.45 K/UL    Absolute Basophil Count 0.00 0 - 0.20 K/UL   PROTIME INR (PT)    Collection Time: 03/17/17 10:30 PM   Result Value Ref Range    INR 1.6 (H) 0.8 - 1.2   COMPREHENSIVE METABOLIC PANEL    Collection Time: 03/17/17 10:30 PM   Result Value Ref Range    Sodium 142 137 - 147 MMOL/L    Potassium 3.6 3.5 - 5.1 MMOL/L    Chloride 104 98 - 110 MMOL/L    Glucose 146 (H) 70 - 100 MG/DL    Blood Urea Nitrogen 12 7 - 25 MG/DL    Creatinine 8.11 (H) 0.4 - 1.00 MG/DL    Calcium 8.8 8.5 - 91.4 MG/DL    Total Protein 6.4 6.0 - 8.0 G/DL    Total Bilirubin 0.6 0.3 - 1.2 MG/DL    Albumin 3.4 (L) 3.5 - 5.0 G/DL    Alk Phosphatase 782 (H) 25 - 110 U/L    AST (SGOT) 17 7 - 40 U/L    CO2 30 21 - 30 MMOL/L    ALT (SGPT) 8 7 - 56 U/L    Anion Gap 8 3 - 12    eGFR Non African American 43 (L) >60 mL/min    eGFR African American 52 (L) >60 mL/min   LIPASE  Collection Time: 03/17/17 10:30 PM   Result Value Ref Range    Lipase <3 (L) 11 - 82 U/L   POC LACTATE    Collection Time: 03/17/17 10:57 PM   Result Value Ref Range    LACTIC ACID POC 1.4 0.5 - 2.0 MMOL/L   TYPE & CROSSMATCH    Collection Time: 03/17/17 11:05 PM   Result Value Ref Range    Units Ordered 0     Crossmatch Expires 03/20/2017     Record Check 2ND TYPE REQUIRED     ABO/RH(D) A POS Antibody Screen NEG     Electronic Crossmatch YES    CBC    Collection Time: 03/18/17  9:11 AM   Result Value Ref Range    White Blood Cells 8.1 4.5 - 11.0 K/UL    RBC 2.72 (L) 4.0 - 5.0 M/UL    Hemoglobin 8.4 (L) 12.0 - 15.0 GM/DL    Hematocrit 16.1 (L) 36 - 45 %    MCV 94.1 80 - 100 FL    MCH 30.9 26 - 34 PG    MCHC 32.8 32.0 - 36.0 G/DL    RDW 09.6 (H) 11 - 15 %    Platelet Count 120 (L) 150 - 400 K/UL    MPV 8.1 7 - 11 FL   BASIC METABOLIC PANEL    Collection Time: 03/18/17  9:11 AM   Result Value Ref Range    Sodium 141 137 - 147 MMOL/L    Potassium 3.9 3.5 - 5.1 MMOL/L    Chloride 102 98 - 110 MMOL/L    CO2 29 21 - 30 MMOL/L    Anion Gap 10 3 - 12    Glucose 159 (H) 70 - 100 MG/DL    Blood Urea Nitrogen 11 7 - 25 MG/DL    Creatinine 0.45 (H) 0.4 - 1.00 MG/DL    Calcium 8.6 8.5 - 40.9 MG/DL    eGFR Non African American 44 (L) >60 mL/min    eGFR African American 53 (L) >60 mL/min   POC GLUCOSE    Collection Time: 03/18/17  9:46 AM   Result Value Ref Range    Glucose, POC 192 (H) 70 - 100 MG/DL   POC GLUCOSE    Collection Time: 03/18/17 11:47 AM   Result Value Ref Range    Glucose, POC 257 (H) 70 - 100 MG/DL   CBC    Collection Time: 03/18/17  1:40 PM   Result Value Ref Range    White Blood Cells 7.9 4.5 - 11.0 K/UL    RBC 2.60 (L) 4.0 - 5.0 M/UL    Hemoglobin 8.1 (L) 12.0 - 15.0 GM/DL    Hematocrit 81.1 (L) 36 - 45 %    MCV 93.6 80 - 100 FL    MCH 31.1 26 - 34 PG    MCHC 33.2 32.0 - 36.0 G/DL    RDW 91.4 (H) 11 - 15 %    Platelet Count 100 (L) 150 - 400 K/UL    MPV 8.3 7 - 11 FL   POC GLUCOSE    Collection Time: 03/18/17  3:23 PM   Result Value Ref Range    Glucose, POC 314 (H) 70 - 100 MG/DL   POC GLUCOSE    Collection Time: 03/18/17  5:34 PM   Result Value Ref Range    Glucose, POC 286 (H) 70 - 100 MG/DL        MELD-Na score: 14 at 03/18/2017  9:11  AM  MELD score: 14 at 03/18/2017  9:11 AM  Calculated from:  Serum Creatinine: 1.29 MG/DL at 5/62/1308  6:57 AM Serum Sodium: 141 MMOL/L (Rounded to 137) at 03/18/2017  9:11 AM  Total Bilirubin: 0.6 MG/DL (Rounded to 1) at 8/46/9629 10:30 PM  INR(ratio): 1.6 at 03/17/2017 10:30 PM  Age: 29 years

## 2017-03-19 ENCOUNTER — Encounter: Admit: 2017-03-19 | Discharge: 2017-03-19 | Payer: MEDICARE

## 2017-03-19 DIAGNOSIS — K760 Fatty (change of) liver, not elsewhere classified: Principal | ICD-10-CM

## 2017-03-19 LAB — POC GLUCOSE
Lab: 288 mg/dL — ABNORMAL HIGH (ref 70–100)
Lab: 108 mg/dL — ABNORMAL HIGH (ref 70–100)
Lab: 106 mg/dL — ABNORMAL HIGH (ref 70–100)
Lab: 161 mg/dL — ABNORMAL HIGH (ref 70–100)
Lab: 146 mg/dL — ABNORMAL HIGH (ref 70–100)
Lab: 182 mg/dL — ABNORMAL HIGH (ref 70–100)
Lab: 224 mg/dL — ABNORMAL HIGH (ref 70–100)
Lab: 249 mg/dL — ABNORMAL HIGH (ref 70–100)

## 2017-03-19 LAB — COMPREHENSIVE METABOLIC PANEL: Lab: 141 MMOL/L — ABNORMAL LOW (ref >60)

## 2017-03-19 LAB — CBC: Lab: 7.8 K/UL — ABNORMAL LOW (ref >60)

## 2017-03-19 LAB — PROTIME INR (PT): Lab: 1.8 MMOL/L — ABNORMAL HIGH (ref 0.8–1.2)

## 2017-03-19 MED ORDER — ALBUMIN, HUMAN 25 % IV SOLP
25 g | Freq: Two times a day (BID) | INTRAVENOUS | 0 refills | Status: CP
Start: 2017-03-19 — End: ?
  Administered 2017-03-20 (×2): 25 g via INTRAVENOUS

## 2017-03-19 MED ORDER — FUROSEMIDE 10 MG/ML IJ SOLN
40 mg | Freq: Two times a day (BID) | INTRAVENOUS | 0 refills | Status: DC
Start: 2017-03-19 — End: 2017-03-20
  Administered 2017-03-20: 14:00:00 40 mg via INTRAVENOUS

## 2017-03-19 MED ORDER — CLONAZEPAM 0.5 MG PO TAB
.5 mg | Freq: Two times a day (BID) | ORAL | 0 refills | Status: DC
Start: 2017-03-19 — End: 2017-03-19

## 2017-03-19 MED ORDER — CLONAZEPAM 0.5 MG PO TAB
.5 mg | Freq: Two times a day (BID) | ORAL | 0 refills | Status: DC
Start: 2017-03-19 — End: 2017-03-21
  Administered 2017-03-19 – 2017-03-20 (×2): 0.5 mg via ORAL

## 2017-03-19 MED ORDER — HYDROXYZINE HCL 25 MG PO TAB
25 mg | Freq: Three times a day (TID) | ORAL | 0 refills | Status: DC | PRN
Start: 2017-03-19 — End: 2017-03-21
  Administered 2017-03-19 – 2017-03-20 (×3): 25 mg via ORAL

## 2017-03-19 NOTE — Care Coordination-Inpatient
[place] COORDINATION    Patient: Alexis Mcconnell  MRN: [id number]  03/19/2017    Admit Date:03/17/2017  Discharge Dates: TBD  Discharge Disposition:    Rounding Physician: Dr Driscilla Grammes    Brief narrative regarding inpatient admission: 51y F with PMHx of NASH cirrhosis with volume overload now admitted with rectal bleeding       Hepatology Follow-ups:      Appointment Date/Time: 04/10/17 @ 2pm   Appointment Provider: Dr Cathleen Corti   Clinic Coordinator Follow up: Labs,EGD      Labs: BMP 1 week. Our service has ordered. Patient requested lab be faxed to PCP Dr Marijean Bravo. Our service will facilitate this   Due Date: TBD     Endoscopy Appointment : Patient scheduled for EGD same day as follow up with Dr Jeannine Kitten 04/10/17      Education addressed: Introduced myself and the role of inpatient nurse coordinator. Met with patient this afternoon. Reviewed above follow up information with the patient as well as touched a little on low sodium diet. Patient given diet teaching hand out to review when she is up to it. Encouraged patient/family to call clinic with any hepatology needs after discharge. [phone].      ~Will be available if patient needs anything from a hepatology standpoint while in the hospital Monday through Friday 7am-5pm    ~Discharge information to be sent to the hepatology provider & team that will follow with patient in the outpatient clinic.      Barrington Ellison, RN-BSN  Inpatient Hepatology Nurse Coordinator  Pgr: [phone]  Office- [phone]

## 2017-03-19 NOTE — Progress Notes
General Progress Note    Name:  Alexis Mcconnell   Today's Date:  03/19/2017  Admission Date: 03/17/2017  LOS: 1 day                     Assessment/Plan:    Active Problems:    Other cirrhosis of liver (HCC)    Hematochezia    Bleeding hemorrhoids    Alexis Mcconnell is a 51 yr old female with history of ESLD sec to NASH c/b ascites and volume overload who presents with hematochezia and volume overload    ESLD sec to NASH  MELD-Na score: 16 at 03/19/2017  5:10 AM  MELD score: 16 at 03/19/2017  5:10 AM  Calculated from:  Serum Creatinine: 1.37 MG/DL at 4/54/0981  1:91 AM  Serum Sodium: 141 MMOL/L (Rounded to 137) at 03/19/2017  5:10 AM  Total Bilirubin: 0.5 MG/DL (Rounded to 1) at 4/78/2956  5:10 AM  INR(ratio): 1.8 at 03/19/2017  5:10 AM  Age: 51 years   - not evaluated for transplant, BMI kg/m???  - CT abt/pelvis wo contrast 03/18/17, no evidence of bowel obstruction or definite intra-abdominal inflammatory mass, mildly enlarged cirrhotic liver, portal hypertension with mesenteric congestion & mild splenomegaly, no significant ascites, marked body wall edema/fluid, atrophic pancreas with coarse calcifications compatible sequelae of chronic calcific pancreatitis, healing right superior and inferior pubic ramus fracture deformities    Hematochezia, resolved  - BM now without evidence of bleeding, normal in color  - patient's currently volume overload places her at risk for hemorrhoidal bleeding  - colonoscopy 01/10/17, showed external hemorrhoids  - rectal exam on admission revealed scan red blood in rectal vault  - Hgb stable, 8.9 <-- 8.4, has not required transfusion  - Anusol suppository BID for 7-10 days  - no plan for endoscopic intervention at this time    Volume overload  - marked body wall edema noted on CT exam, taut lower extremity edema; no ascites on CT abd/pelvis  - UO 6.6L/24hrs, negative 4.6L/24hrs, goal for net negative 2-3 liters out daily  - change Lasix to 40mg  IV BID - continue Spironolactone 100mg  daily  - recommend Albumin 25% 25g BID x 2 doses  - 2gm low sodium diet  - accurate I&O  - daily weights  - monitor electrolytes and renal function    Hepatic encephalopathy  - MS stable  - continue Rifaximin and Lactulose titrated to 3-4 BMs daily  - resumed PTA Clonazepam and Oxycodone, monitor MS    FEN  - 2gm low sodium diet  - recommend Dietitian for low sodium diet education    Disposition  - FU with Dr. Constance Goltz in the Hepatology clinic on 04/10/17 at 2:00  - patient may call RN/coordinator at [phone] for questions/concerns prior to FU appointment    Patient discussed with Dr. Casper Harrison.   ________________________________________________________________________    Subjective  Alexis Mcconnell is a 51 y.o. female sitting up in bed eating lunch. Has had 2 BMs which are normal orange brown in color. States she feels a little fuzzy meaning dizzy. Answers all orientation questions appropriately however is unable to recall current year. States, I'm drawing a blank. Denies BRBPR. Denies fever, SOB, palpitations, CP, abd pain, n/v. Tolerating PO.     Medications  Scheduled Meds:    clonazePAM (KLONOPIN) tablet 0.5 mg 0.5 mg Oral BID   fluoxetine (PROZAC) capsule 40 mg 40 mg Oral QDAY   furosemide (LASIX) injection 40 mg 40 mg Intravenous Q8H*  gabapentin (NEURONTIN) capsule 600 mg 600 mg Oral TID   gemfibrozil (LOPID) tablet 600 mg 600 mg Oral BID   hemorrhoidal prep (ANUSOL) rectal suppository 1 suppository 1 suppository Rectal BID   insulin aspart U-100 (NOVOLOG FLEXPEN) injection PEN 0-28 Units 0-28 Units Subcutaneous ACHS   insulin glargine (LANTUS SOLOSTAR, BASAGLAR) injection PEN 50 Units 50 Units Subcutaneous QHS   lactulose oral solution 30 g 30 g Oral QID   levothyroxine (SYNTHROID) tablet 175 mcg 175 mcg Oral QDAY   metoclopramide (REGLAN) tablet 10 mg 10 mg Oral BID before meals   pantoprazole DR (PROTONIX) tablet 40 mg 40 mg Oral BID rifAXIMin (XIFAXAN) tablet 550 mg 550 mg Oral BID   spironolactone (ALDACTONE) tablet 100 mg 100 mg Oral QDAY   valACYclovir (VALTREX) tablet 500 mg 500 mg Oral QDAY   vitamin A capsule 10,000 Units 10,000 Units Oral QDAY   Continuous Infusions:  PRN and Respiratory Meds:acetaminophen Q6H PRN, hydrOXYzine TID PRN, ondansetron Q6H PRN, oxyCODONE Q4H PRN     Objective:                          Vital Signs: Last Filed                 Vital Signs: 24 Hour Range   BP: 100/51 (08/15 1100)  Temp: 36.9 ???C (98.5 ???F) (08/15 1100)  Pulse: 54 (08/15 1100)  Respirations: 19 PER MINUTE (08/15 1100)  SpO2: 98 % (08/15 1100)  O2 Delivery: None (Room Air) (08/15 1100) BP: (99-123)/(45-68)   Temp:  [36.7 ???C (98 ???F)-37.2 ???C (98.9 ???F)]   Pulse:  [54-62]   Respirations:  [16 PER MINUTE-20 PER MINUTE]   SpO2:  [93 %-100 %]   O2 Delivery: None (Room Air)   Intensity Pain Scale 0-10 (Pain 1): 8 (03/19/17 0920) Vitals:    03/17/17 1813 03/18/17 1758 03/19/17 0647   Weight: (!) 149.7 kg (330 lb) (!) 147.9 kg (326 lb 0.6 oz) (!) 143.8 kg (317 lb 1.9 oz)       Intake/Output Summary:  (Last 24 hours)    Intake/Output Summary (Last 24 hours) at 03/19/17 1435  Last data filed at 03/19/17 1423   Gross per 24 hour   Intake             2228 ml   Output             7500 ml   Net            -5272 ml      Stool Occurrence: 0    Physical Exam  Gen: alert, oriented, in no distress  HEENT:  NCAT, sclera clear  Resp: CTAB, non labored, RA  CV: RRR, 3+ bilat LE edema, body wall edema  Abd: morbidly obese, large panus, non tender, non distended, BS+  Skin: no rash, lesion or jaundice, mild erythema to bilat LE  Neuro: non focal exam, no asterixis    Lab Review  24-hour labs:    Results for orders placed or performed during the hospital encounter of 03/17/17 (from the past 24 hour(s))   POC GLUCOSE    Collection Time: 03/18/17  3:23 PM   Result Value Ref Range    Glucose, POC 314 (H) 70 - 100 MG/DL   POC GLUCOSE    Collection Time: 03/18/17  5:34 PM Result Value Ref Range    Glucose, POC 286 (H) 70 - 100 MG/DL   POC GLUCOSE  Collection Time: 03/18/17  8:29 PM   Result Value Ref Range    Glucose, POC 288 (H) 70 - 100 MG/DL   POC GLUCOSE    Collection Time: 03/18/17 11:54 PM   Result Value Ref Range    Glucose, POC 108 (H) 70 - 100 MG/DL   POC GLUCOSE    Collection Time: 03/19/17  1:33 AM   Result Value Ref Range    Glucose, POC 106 (H) 70 - 100 MG/DL   POC GLUCOSE    Collection Time: 03/19/17  3:24 AM   Result Value Ref Range    Glucose, POC 161 (H) 70 - 100 MG/DL   CBC    Collection Time: 03/19/17  5:10 AM   Result Value Ref Range    White Blood Cells 7.8 4.5 - 11.0 K/UL    RBC 2.80 (L) 4.0 - 5.0 M/UL    Hemoglobin 8.9 (L) 12.0 - 15.0 GM/DL    Hematocrit 16.1 (L) 36 - 45 %    MCV 93.7 80 - 100 FL    MCH 31.7 26 - 34 PG    MCHC 33.9 32.0 - 36.0 G/DL    RDW 09.6 (H) 11 - 15 %    Platelet Count 115 (L) 150 - 400 K/UL    MPV 8.5 7 - 11 FL   COMPREHENSIVE METABOLIC PANEL    Collection Time: 03/19/17  5:10 AM   Result Value Ref Range    Sodium 141 137 - 147 MMOL/L    Potassium 3.8 3.5 - 5.1 MMOL/L    Chloride 100 98 - 110 MMOL/L    Glucose 150 (H) 70 - 100 MG/DL    Blood Urea Nitrogen 12 7 - 25 MG/DL    Creatinine 0.45 (H) 0.4 - 1.00 MG/DL    Calcium 8.6 8.5 - 40.9 MG/DL    Total Protein 6.4 6.0 - 8.0 G/DL    Total Bilirubin 0.5 0.3 - 1.2 MG/DL    Albumin 3.5 3.5 - 5.0 G/DL    Alk Phosphatase 811 (H) 25 - 110 U/L    AST (SGOT) 23 7 - 40 U/L    CO2 32 (H) 21 - 30 MMOL/L    ALT (SGPT) 10 7 - 56 U/L    Anion Gap 9 3 - 12    eGFR Non African American 41 (L) >60 mL/min    eGFR African American 50 (L) >60 mL/min   PROTIME INR (PT)    Collection Time: 03/19/17  5:10 AM   Result Value Ref Range    INR 1.8 (H) 0.8 - 1.2   POC GLUCOSE    Collection Time: 03/19/17  7:25 AM   Result Value Ref Range    Glucose, POC 146 (H) 70 - 100 MG/DL   POC GLUCOSE    Collection Time: 03/19/17 10:41 AM   Result Value Ref Range    Glucose, POC 182 (H) 70 - 100 MG/DL   POC GLUCOSE Collection Time: 03/19/17 12:54 PM   Result Value Ref Range    Glucose, POC 224 (H) 70 - 100 MG/DL       Point of Care Testing  (Last 24 hours)  Glucose: (!) 150 (03/19/17 0510)  POC Glucose (Download): (!) 224 (03/19/17 1254)    Radiology and other Diagnostics Review:    Pertinent radiology reviewed.    Silvio Clayman, APRN   Pager [phone]

## 2017-03-20 ENCOUNTER — Inpatient Hospital Stay: Admit: 2017-03-17 | Discharge: 2017-03-20 | Disposition: A | Discharge status: Home / Self Care | Payer: MEDICARE

## 2017-03-20 DIAGNOSIS — D500 Iron deficiency anemia secondary to blood loss (chronic): ICD-10-CM

## 2017-03-20 DIAGNOSIS — E1122 Type 2 diabetes mellitus with diabetic chronic kidney disease: ICD-10-CM

## 2017-03-20 DIAGNOSIS — K7469 Other cirrhosis of liver: ICD-10-CM

## 2017-03-20 DIAGNOSIS — L03115 Cellulitis of right lower limb: ICD-10-CM

## 2017-03-20 DIAGNOSIS — K921 Melena: Principal | ICD-10-CM

## 2017-03-20 DIAGNOSIS — K3184 Gastroparesis: ICD-10-CM

## 2017-03-20 DIAGNOSIS — Z794 Long term (current) use of insulin: ICD-10-CM

## 2017-03-20 DIAGNOSIS — E1143 Type 2 diabetes mellitus with diabetic autonomic (poly)neuropathy: ICD-10-CM

## 2017-03-20 DIAGNOSIS — E039 Hypothyroidism, unspecified: ICD-10-CM

## 2017-03-20 DIAGNOSIS — E8770 Fluid overload, unspecified: ICD-10-CM

## 2017-03-20 DIAGNOSIS — K644 Residual hemorrhoidal skin tags: ICD-10-CM

## 2017-03-20 DIAGNOSIS — E1142 Type 2 diabetes mellitus with diabetic polyneuropathy: ICD-10-CM

## 2017-03-20 DIAGNOSIS — N182 Chronic kidney disease, stage 2 (mild): ICD-10-CM

## 2017-03-20 DIAGNOSIS — E1165 Type 2 diabetes mellitus with hyperglycemia: ICD-10-CM

## 2017-03-20 DIAGNOSIS — B009 Herpesviral infection, unspecified: ICD-10-CM

## 2017-03-20 DIAGNOSIS — Z87891 Personal history of nicotine dependence: ICD-10-CM

## 2017-03-20 LAB — VITAMIN B12: Lab: 133 pg/mL — ABNORMAL HIGH (ref >60)

## 2017-03-20 LAB — POC GLUCOSE
Lab: 291 mg/dL — ABNORMAL HIGH (ref 70–100)
Lab: 188 mg/dL — ABNORMAL HIGH (ref 70–100)
Lab: 134 mg/dL — ABNORMAL HIGH (ref 70–100)
Lab: 131 mg/dL — ABNORMAL HIGH (ref 70–100)
Lab: 159 mg/dL — ABNORMAL HIGH (ref 70–100)
Lab: 208 mg/dL — ABNORMAL HIGH (ref 70–100)
Lab: 178 mg/dL — ABNORMAL HIGH (ref 70–100)

## 2017-03-20 LAB — FOLATE, SERUM: Lab: >24 ng/mL — ABNORMAL LOW (ref >3.9)

## 2017-03-20 LAB — CBC: Lab: 8.2 10*3/uL — ABNORMAL LOW (ref 4.5–11.0)

## 2017-03-20 LAB — PROTIME INR (PT): Lab: 2 MMOL/L — ABNORMAL HIGH (ref >60)

## 2017-03-20 LAB — COMPREHENSIVE METABOLIC PANEL: Lab: 139 MMOL/L — ABNORMAL LOW (ref >60)

## 2017-03-20 MED ORDER — CEPHALEXIN 500 MG PO CAP
500 mg | ORAL_CAPSULE | Freq: Four times a day (QID) | ORAL | 0 refills | Status: SS
Start: 2017-03-20 — End: 2017-04-10

## 2017-03-20 MED ORDER — HEMORRHOIDAL SUPP (PHENYLEPHRINE 0.25 %)
1 | Freq: Two times a day (BID) | RECTAL | 0 refills | 6.00000 days | Status: AC
Start: 2017-03-20 — End: ?

## 2017-03-20 MED ORDER — SPIRONOLACTONE 100 MG PO TAB
100 mg | ORAL_TABLET | Freq: Every day | ORAL | 0 refills | 46.00000 days | Status: AC
Start: 2017-03-20 — End: 2017-05-16

## 2017-03-20 MED ORDER — ALBUTEROL SULFATE 90 MCG/ACTUATION IN HFAA
2 | RESPIRATORY_TRACT | 0 refills | Status: DC | PRN
Start: 2017-03-20 — End: 2017-03-21

## 2017-03-20 MED ORDER — FUROSEMIDE 20 MG PO TAB
40 mg | ORAL_TABLET | Freq: Every day | ORAL | 3 refills | 90.00000 days | Status: AC
Start: 2017-03-20 — End: 2017-10-09

## 2017-03-20 MED ORDER — FUROSEMIDE 10 MG/ML IJ SOLN
40 mg | Freq: Every day | INTRAVENOUS | 0 refills | Status: DC
Start: 2017-03-20 — End: 2017-03-21

## 2017-03-20 NOTE — Progress Notes
RESPIRATORY THERAPY  ADULT PROTOCOL EVALUATION      RESPIRATORY PROTOCOL PLAN    Medications  Albuterol: MDI PRN    Note: If indicated by protocol, medication orders will be placed by therapist.    Procedures  PEP Therapy: Place a nursing order for IS Q1h While Awake for any of Lung Expansion indicators  Oxygen/Humidity: O2 to keep SpO2 > 92%  Monitoring: Pulse oximetry continuous during night/sleep (OSA)    Comment: OSA  _____________________________________________________________    PATIENT EVALUATION RESULTS    Chart Review  * Pulmonary Hx: Hx pulmonary disease, hx reactive or obstructive airway disease (PEFR & AM) OR regular home use of bronchodilators (AM) OR inhaled or systemic steroid use for lungs < or equal to 4 times/yr (AM) (Asthma, Albuterol PRN)    * Surgical Hx: No surgery OR last surgery > 6 weeks ago OR trach/stoma (BA)    * Chest X-Ray: Clear OR not available (No recent CXR)    * PFT/Oxygenation: FEV1, PEFR 70-80% OR Pa02 70-80 RA OR Sp02 92-95%      Patient Assessment  * Respiratory Pattern: Regular pattern and rate OR good chest excursion with deep breathing    * Breath Sounds: Clear apically, but diminished in bases (LE) OR CHF related crackles (02) (oximetry)    * Cough / Sputum: Strong, effective cough OR nonproductive    * Mental Status: Alert, oriented, cooperative    * Activity Level: Ambulatory with assistance      Priority Index  Total Points: 6 Points  * Priority Index: 1+    PRIORITY INDEX GUIDELINES*  Priority Points   1 0-9 points   2 9-18 points   3 > 18 points   + Pulm Dx or Home Rx   *Higher points indicate higher acuity.      Therapist: Selmer Dominion  Date: 03/19/2017      Key  AC = Airway clearance  AM = Aerosolized medication  BA = Bland aerosol  DB&C = Deep breathe & cough  FEV1 = Forced expiratory volume in first second)  IC = Inspiratory capacity  LE = Lung expansion  MDI = Metered dose inhaler  Neb = Nebulizer  O2 = Oxygen  Oxim =Oximetry  PEFR = Peak expiratory flow rate RRT = Rapid Response Team

## 2017-03-20 NOTE — Progress Notes
PHYSICAL THERAPY  ASSESSMENT/DISHCARGE    MOBILITY:  Mobility  Progressive Mobility Level: Walk in hallway  Distance Walked (feet): 150 ft  Level of Assistance: Stand by assistance  Assistive Device: Cane  Time Tolerated: 11-30 minutes  Activity Limited By: Dizziness    SUBJECTIVE:  Subjective  Significant hospital events: Pt admitted with hematochezia and increased wiehgt gain BMI 59.  PMH: ESLD, encephalopathy, RLE erythema, Herpes.  Mental / Cognitive Status: Alert;Oriented;To Person;To Place;To Situation  Persons Present: Physical Therapist  Pain: Patient complains of pain;Before activity;7/10  Pain Location: Back  Pain Description: Aching  Pain Interventions: Nursing staff notified of patient's pain level;Nurse provides pain meds  Ambulation Assist: Independent Mobility in Community with Endurance Limitations  Patient Owned Equipment: Single Point Cane;4-Wheeled [employer] Wheelchair  Home Situation: Lives with Family (souse (works))  Type of Home: Insurance account manager Stairs: 6-10 Stairs (x6)  In-Home Stairs: 1-2 Stairs (x1)    ROM:  ROM  LE ROM: WFL    STRENGTH:  Strength  Overall Strength: WFL    POSTURE/NEURO:  Posture / Neurological  Overall Sensation/Proprioception: No Deficits Noted    BED MOBILITY/TRANSFERS:  Bed Mobility/Transfers  Bed Mobility: Supine to Sit: Standby Assist  Transfer Type: Sit to Stand  Transfer: Assistance Level: To/From;Bed;Toilet;Standby Assist  Transfer: Assistive Device: Nurse, adult  Transfers: Type Of Assistance: For Safety Considerations  End Of Activity Status: Up in Chair;Nursing Notified;Instructed Patient to Request Assist with Mobility    BALANCE:  Balance  Standing Balance: Dynamic Standing Balance;1 UE support;Standby Assist    GAIT:  Gait  Gait Distance: 150 feet  Gait: Assistance Level: Standby Assist  Gait: Assistive Device: Single [employer]  Comments: Increased lateral trunk lean, wide bas of support  Stairs: Number Climbed: 2 Stairs: Descriptors: Ascend;Descend;Non-Reciprocal  Stairs: Assistance Level: Ascend;Descend;Standby Assist  Stairs: Assistive Device: One Rail  Activity Limited By: Complaint of Dizziness (per team, related to diaretics, blood pressure taken [facility])    EDUCATION:  Education  Persons Educated: Patient  Patient Barriers To Learning: None Noted  Teaching Methods: Verbal Instruction;Demonstration  Patient Response: Verbalized Understanding;Return Demonstration  Topics: Plan/Goals of PT Interventions;Mobility Progression;Importance of Increasing Activity;Ambulate With Nursing    ASSESSMENT/PROGRESS:  Assessment/Progress  Assessment/Progress: Patient current status and level of safety suggests progression of activity can be achieved with nursing and/or family and does not require physical therapist intervention  AM-PAC 6 Clicks Basic Mobility Inpatient  Turning from your back to your side while in a flat bed without using bed rails: None  Moving from lying on your back to sitting on the side of a flatbed without using bedrails : None  Moving to and from a bed to a chair (including a wheelchair): None  Standing up from a chair using your arms (e.g. wheelchair, or bedside chair): None  To walk in hospital room: None  Climbing 3-5 steps with a railing: A Little  Raw Score: 23  Standardized (T-scale) Score: 50.88  Basic Mobility CMS 0-100%: 16.55  CMS G Code Modifier for Basic Mobility: CI    G-Codes: Mobility  [id number] Current Status:  1-19% Impairment  G8979 Goal Status:  1-19% Impairment  G8980 Discharge Status:  1-19% Impairment      Based on above evaluation and clinical judgment.      GOALS:  Goals  Goal Formulation: With Patient    PLAN:  Plan   Plan Frequency: No Further Treatment    RECOMMENDATIONS:  PT Discharge Recommendations  PT Discharge Recommendations: Home with Assistance  Equipment Recommendations: Patient owns necessary equipment    Therapist: Angelia Mould, [place] DPT [id number]  Date: 03/20/2017

## 2017-03-20 NOTE — Progress Notes
Alexis Mcconnell discharged on 03/20/2017.   Marland Kitchen  Discharge instructions reviewed with patient.   Valuables returned:   Personal Items / Valuables: Eyeglasses/Contacts, Cell Phone, Clothing.  Home medications:    .  Functional assessment at discharge complete: yes

## 2017-03-20 NOTE — Progress Notes
Progress Note - Medicine      Today's Date:  03/19/2017  Date of Admission: 03/17/2017 10:14 PM  HD # LOS: 0 days    Name: Alexis Mcconnell                DOB: 1966-07-21         Age: 51 y.o.       MRN:  [id number]         Assessment/Plan:     Active Problems:    Other cirrhosis of liver (HCC)    Hematochezia    Bleeding hemorrhoids      ====================================================================================================    Ms. Alexis Mcconnell is a 51 y.o. female with PMH of ESLD and external hemorrhoids who presented with hematochezia.     Hematochezia  - 3-4 bowel movements per day with bright red blood per rectum since Saturday   - hemoglobin drop from baseline but stable since admission and vitals stable  - no further hematochezia since admission   - Colonoscopy 01/13/2017 with external hemorrhoids  - bleeding secondary to hemorrhoids  PLAN  - anusol BID  - monitor for bleeding   ???  Cirrhosis of the liver  -Continue PTA lactulose, rifaximin  - decrease lasix to BID, 6.5 L UOP since yesterday  - cont aldactone   - low sodium diet  - I/O    RLE Erythema   - slightly warm to touch, concern for cellulitis  - erythema marked  - if persistent or expanding despite diuresis, will start on keflex  ???  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  Gastroparesis  Restarted PTA reglan 10 bid  ???  Herpes Restarted PTA valtrex    FEN:  No ivf  Low sodium diet  PPx: SCD due to bleeding  Full code  Dispo: d/c tomorrow if hemoglobin stable      Total time 40 minutes.  Estimated counseling time 15 minutes.  Counseled patient regarding reason for bleeding, diuresis and management of leg erythema.      Crista Elliot, MD  Med Private [phone]      __________________________________________________________________________________  Subjective:  No acute events overnight. Reports two BMs without blood since admission. Reports some hand cramping this morning.       Objective:  Medications:  Scheduled Meds:  albumin 25% injection 25 g 25 g Intravenous BID   clonazePAM (KLONOPIN) tablet 0.5 mg 0.5 mg Oral BID   fluoxetine (PROZAC) capsule 40 mg 40 mg Oral QDAY   [START ON 03/20/2017] furosemide (LASIX) injection 40 mg 40 mg Intravenous BID(9-17)   gabapentin (NEURONTIN) capsule 600 mg 600 mg Oral TID   gemfibrozil (LOPID) tablet 600 mg 600 mg Oral BID   hemorrhoidal prep (ANUSOL) rectal suppository 1 suppository 1 suppository Rectal BID  insulin aspart U-100 (NOVOLOG FLEXPEN) injection PEN 0-28 Units 0-28 Units Subcutaneous ACHS   insulin glargine (LANTUS SOLOSTAR, BASAGLAR) injection PEN 50 Units 50 Units Subcutaneous QHS   lactulose oral solution 30 g 30 g Oral QID   levothyroxine (SYNTHROID) tablet 175 mcg 175 mcg Oral QDAY   metoclopramide (REGLAN) tablet 10 mg 10 mg Oral BID before meals   pantoprazole DR (PROTONIX) tablet 40 mg 40 mg Oral BID   rifAXIMin (XIFAXAN) tablet 550 mg 550 mg Oral BID   spironolactone (ALDACTONE) tablet 100 mg 100 mg Oral QDAY   valACYclovir (VALTREX) tablet 500 mg 500 mg Oral QDAY   vitamin A capsule 10,000 Units 10,000 Units Oral QDAY   Continuous Infusions:  PRN and Respiratory Meds:acetaminophen Q6H PRN, hydrOXYzine TID PRN, ondansetron Q6H PRN, oxyCODONE Q4H PRN       Vitals:                Vital Signs:  Last Filed             Vital Signs:  24 Hour Range   BP: 100/51 (08/15 1100) Temp: 36.9 ???C (98.5 ???F) (08/15 1100)  Pulse: 54 (08/15 1100)  Respirations: 19 PER MINUTE (08/15 1100)  SpO2: 98 % (08/15 1100)  O2 Delivery: None (Room Air) (08/15 1100)  BP: (99-123)/(45-68)   Temp:  [36.7 ???C (98 ???F)-37.2 ???C (98.9 ???F)]   Pulse:  [54-62]   Respirations:  [18 PER MINUTE-20 PER MINUTE]   SpO2:  [93 %-100 %]   O2 Delivery: None (Room Air)   Intensity Pain Scale 0-10 (Pain 1): (not recorded) Vitals:    03/17/17 1813 03/18/17 1758 03/19/17 0647   Weight: (!) 149.7 kg (330 lb) (!) 147.9 kg (326 lb 0.6 oz) (!) 143.8 kg (317 lb 1.9 oz)       Wt Readings from Last 1 Encounters:   03/19/17 (!) 143.8 kg (317 lb 1.9 oz)       PIPP Score      FLACC         Intake/Output Summary :  (Last 24 hours)    Intake/Output Summary (Last 24 hours) at 03/19/17 1956  Last data filed at 03/19/17 1553   Gross per 24 hour   Intake             1738 ml   Output             7300 ml   Net            -5562 ml       Physical Exam:  Body mass index is 59.92 kg/m???.  CON: alert, oriented, NAD, morbidly obese  Head: nc/at  Heart: RRR, no murmur  Lungs: clear on anterior exam  Abd normoactive bowel sounds  Ext: 1+ pitting edema, RLE erythema and calor  Psych: normal mood and affect    Lab Review  Recent Labs      03/17/17   2230  03/18/17   0911  03/19/17   0510   NA  142  141  141   K  3.6  3.9  3.8   CL  104  102  100   CO2  30  29  32*   GAP  8  10  9    BUN  12  11  12    CR  1.31*  1.29*  1.37*   GLU  146*  159*  150*   CA  8.8  8.6  8.6   ALBUMIN  3.4*   --   3.5       Recent Labs      03/17/17   2230  03/18/17   0911  03/18/17   1340  03/19/17   0510   WBC  7.9  8.1  7.9  7.8   HGB  8.6*  8.4*  8.1*  8.9*   HCT  26.1*  25.6*  24.3*  26.3*   PLTCT  119*  120*  100*  115*   INR  1.6*   --    --   1.8*   AST  17   --    --   23   ALT  8   --    --   10   ALKPHOS  197*   --    --   194*      Estimated Creatinine Clearance: 67.6 mL/min (A) (based on SCr of 1.37 mg/dL (H)).  Vitals:    03/17/17 1813 03/18/17 1758 03/19/17 0647 Weight: (!) 149.7 kg (330 lb) (!) 147.9 kg (326 lb 0.6 oz) (!) 143.8 kg (317 lb 1.9 oz)    No results for input(s): PHART, PO2ART in the last 72 hours.    Invalid input(s): PC02A      Point of Care Testing  (Last 24 hours)  Glucose: (!) 150 (03/19/17 0510)  POC Glucose (Download): (!) 249 (03/19/17 1720)    Radiology and other Diagnostics Review:    Pertinent radiology reviewed.

## 2017-03-20 NOTE — Progress Notes
OCCUPATIONAL THERAPY  ASSESSMENT NOTE/ DISCHARGE  Patient Name: Alexis Mcconnell                   Room/Bed: GN5621/30  Admitting Diagnosis: Hematochezia  Past Medical History:   Diagnosis Date   ??? Asthma    ??? Disorder of thyroid gland    ??? Liver cirrhosis (HCC)     diagnosed in [place]   ??? Psychiatric illness      Mobility  Progressive Mobility Level: Walk in hallway  Distance Walked (feet): 150 ft  Level of Assistance: Stand by assistance  Assistive Device: Cane  Time Tolerated: 11-30 minutes  Activity Limited By: Dizziness    Subjective  Pertinent Dx per Physician: 51 y.o. female with PMH of ESLD and external hemorrhoids who presented with hematochezia  Pain / Complaints: Patient agrees to participate in therapy (RN informed of pt's request for pain medication)    Objective  Psychosocial Status: Willing and Cooperative to Participate  Persons Present: Physical Therapist    Home Living  Type of Home: House  Home Layout:  (6 steps to enter, 1 interior stair to bathroom)  Financial risk analyst / Tub: Tub/Shower Unit  Home Equipment: Medical laboratory scientific officer    Prior Function  Level Of Independence: Independent with ADLs and functional transfers (husband assists with bathing)  Lives With: Spouse  Other Function Comments: no hx of falls per pt       ADL's  Where Assessed: Standing at [employer];In [employer] Assist: Stand By Assist  Grooming Deficits: Standing With Education administrator;Wash/Dry Hands  Toileting Assist: Stand By Assist  Toileting Deficits: Perineal Hygiene  Functional Transfer Assist: Stand By Assist  Functional Transfer Deficits: Statistician    Activity Tolerance  Endurance: 2/5 Tolerates 10-20 Minutes Exercise w/Multiple Rests  Sitting Balance: 2+/5 Supports Self w/ 1 UE    Cognition  Cognition Comment: flat affect but cooperative, follows commands appropriately       UE Strength / Tone  Overall Strength / Tone: WFL Able to Perform ADL Tasks;Right;Left    Education  Persons Educated: Patient Teaching Methods: Verbal Instruction  Patient Response: Verbalized Understanding  Topics: Role of OT, Goals for Therapy  Goal Formulation: With Patient    Assessment  No Skilled OT: No Acute OT Goals Identified    AM-PAC 6 Clicks Daily Activity Inpatient  Putting on and taking off regular lower body clothes?: A Little  Bathing (Including washing, rinsing, drying): A Little  Toileting, which includes using toilet, bedpan, or urinal: A Little  Putting on and taking off regular upper body clothing: None  Taking care of personal grooming such as brushing teeth: None  Eating meals?: None  Daily Activity Raw Score: 21  Standardized (t-scale) score: 44.27  CMS 0-100% Score: 32.79  CMS G Code Modifier: CJ    Plan   Progress: Discontinue OT  OT Frequency: No Further Treatment       OT Discharge Recommendations  OT Discharge Recommendations: Home with family assist    G-Codes: Self-care  [phone] Current Status:  20-39% Impairment  G8988 Goal Status:  20-39% Impairment  G8989 Discharge Status:  20-39% Impairment  Based on above evaluation and clinical judgment.    Therapist: Pricilla Larsson, OT  Date: 03/20/2017

## 2017-03-20 NOTE — Progress Notes
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  - CT abd/pelvis wo contrast 03/18/17, no evidence of bowel obstruction or definite intra-abdominal inflammatory mass, mildly enlarged cirrhotic liver, portal hypertension with mesenteric congestion & mild splenomegaly, no significant ascites, marked body wall edema/fluid, atrophic pancreas with coarse calcifications compatible sequelae of chronic calcific pancreatitis, healing right superior and inferior pubic ramus fracture deformities    Hematochezia, resolved  - BM now without evidence of bleeding, normal in color  - patient's current volume overload places her at risk for hemorrhoidal bleeding  - colonoscopy 01/10/17, showed external hemorrhoids  - rectal exam on admission revealed scan red blood in rectal vault  - Hgb stable, 7.9 <-- 8.9, s/p 25% Albumin 25gm, has not required transfusion  - continue Anusol suppository BID for 7-10 days  - no plan for endoscopic intervention at this time    Volume overload  - marked body wall edema noted on CT exam, taut lower extremity edema; no ascites on CT abd/pelvis  - UO 5.5L/24hrs, negative 3.2L/24hrs, net negative 8.4L since admission - s/p Albumin 25% 25g BID x 2 doses  - 2gm low sodium diet  - resume PO diuretics on discharge, Lasix 40mg  daily and Spironolactone 100mg  daily  - diuretics to be titrated as outpatient, FU 9/6  - BMP in 1 week    Hepatic encephalopathy  - MS stable  - continue Rifaximin and Lactulose titrated to 3-4 BMs daily  - resumed PTA Clonazepam and Oxycodone, monitor MS    FEN  - 2gm low sodium diet  - appreciate Dietitian assistance for low sodium diet education    Disposition  - stable for discharge today  - FU with Dr. Constance Goltz in the Hepatology clinic on 04/10/17 at 2:00  - BMP in one week with results faxed to Dr. Constance Goltz, [phone]  - patient may call RN/coordinator at [phone] for questions/concerns prior to FU appointment    Patient discussed with Dr. Casper Harrison. Discussed with MP-M, Dr. Ladona Ridgel.   ________________________________________________________________________    Subjective  Alexis Mcconnell is a 51 y.o. female OOB to chair, feeling better today though states she still feels a little woozy. Denies fever, SOB, CP, abd pain, n/v. Has had BM without blood noted. Tolerating PO.     Medications  Scheduled Meds:    albumin 25% injection 25 g 25 g Intravenous BID   clonazePAM (KLONOPIN) tablet 0.5 mg 0.5 mg Oral BID   fluoxetine (PROZAC) capsule 40 mg  40 mg Oral QDAY   furosemide (LASIX) injection 40 mg 40 mg Intravenous BID(9-17)   gabapentin (NEURONTIN) capsule 600 mg 600 mg Oral TID   gemfibrozil (LOPID) tablet 600 mg 600 mg Oral BID   hemorrhoidal prep (ANUSOL) rectal suppository 1 suppository 1 suppository Rectal BID   insulin aspart U-100 (NOVOLOG FLEXPEN) injection PEN 0-28 Units 0-28 Units Subcutaneous ACHS   insulin glargine (LANTUS SOLOSTAR, BASAGLAR) injection PEN 50 Units 50 Units Subcutaneous QHS   lactulose oral solution 30 g 30 g Oral QID   levothyroxine (SYNTHROID) tablet 175 mcg 175 mcg Oral QDAY   metoclopramide (REGLAN) tablet 10 mg 10 mg Oral BID before meals pantoprazole DR (PROTONIX) tablet 40 mg 40 mg Oral BID   rifAXIMin (XIFAXAN) tablet 550 mg 550 mg Oral BID   spironolactone (ALDACTONE) tablet 100 mg 100 mg Oral QDAY   valACYclovir (VALTREX) tablet 500 mg 500 mg Oral QDAY   vitamin A capsule 10,000 Units 10,000 Units Oral QDAY   Continuous Infusions:  PRN and Respiratory Meds:acetaminophen Q6H PRN, hydrOXYzine TID PRN, ondansetron Q6H PRN, oxyCODONE Q4H PRN     Objective:                          Vital Signs: Last Filed                 Vital Signs: 24 Hour Range   BP: 114/54 (08/16 0738)  Temp: 36.8 ???C (98.2 ???F) (08/16 1191)  Pulse: 67 (08/16 0738)  Respirations: 20 PER MINUTE (08/16 0738)  SpO2: 95 % (08/16 0738)  O2 Delivery: None (Room Air) (08/16 0738) BP: (100-114)/(41-54)   Temp:  [36.8 ???C (98.2 ???F)-37.2 ???C (98.9 ???F)]   Pulse:  [54-67]   Respirations:  [18 PER MINUTE-20 PER MINUTE]   SpO2:  [93 %-100 %]   O2 Delivery: None (Room Air)   Intensity Pain Scale 0-10 (Pain 1): 8 (03/19/17 0920) Vitals:    03/17/17 1813 03/18/17 1758 03/19/17 0647   Weight: (!) 149.7 kg (330 lb) (!) 147.9 kg (326 lb 0.6 oz) (!) 143.8 kg (317 lb 1.9 oz)       Intake/Output Summary:  (Last 24 hours)    Intake/Output Summary (Last 24 hours) at 03/20/17 4782  Last data filed at 03/20/17 9562   Gross per 24 hour   Intake             1840 ml   Output             5225 ml   Net            -3385 ml      Stool Occurrence: 0    Physical Exam  Gen: alert, oriented, in no distress  HEENT:  NCAT, sclera clear  Resp: CTAB, non labored, RA  CV: RRR, 3+ bilat LE edema, body wall edema  Abd: morbidly obese, large panus, non tender, non distended, BS+  Skin: no rash, lesion or jaundice, mild erythema to bilat LE, R>L  Neuro: non focal exam, no asterixis or tremor    Lab Review  24-hour labs:    Results for orders placed or performed during the hospital encounter of 03/17/17 (from the past 24 hour(s))   POC GLUCOSE    Collection Time: 03/19/17 10:41 AM   Result Value Ref Range Glucose, POC 182 (H) 70 - 100 MG/DL   POC GLUCOSE    Collection Time: 03/19/17 12:54 PM   Result Value Ref  Range    Glucose, POC 224 (H) 70 - 100 MG/DL   POC GLUCOSE    Collection Time: 03/19/17  5:20 PM   Result Value Ref Range    Glucose, POC 249 (H) 70 - 100 MG/DL   POC GLUCOSE    Collection Time: 03/19/17  8:09 PM   Result Value Ref Range    Glucose, POC 291 (H) 70 - 100 MG/DL   POC GLUCOSE    Collection Time: 03/19/17 11:04 PM   Result Value Ref Range    Glucose, POC 188 (H) 70 - 100 MG/DL   POC GLUCOSE    Collection Time: 03/20/17 12:28 AM   Result Value Ref Range    Glucose, POC 134 (H) 70 - 100 MG/DL   POC GLUCOSE    Collection Time: 03/20/17  2:16 AM   Result Value Ref Range    Glucose, POC 131 (H) 70 - 100 MG/DL   CBC    Collection Time: 03/20/17  5:10 AM   Result Value Ref Range    White Blood Cells 8.2 4.5 - 11.0 K/UL    RBC 2.53 (L) 4.0 - 5.0 M/UL    Hemoglobin 7.9 (L) 12.0 - 15.0 GM/DL    Hematocrit 16.1 (L) 36 - 45 %    MCV 92.5 80 - 100 FL    MCH 31.0 26 - 34 PG    MCHC 33.5 32.0 - 36.0 G/DL    RDW 09.6 (H) 11 - 15 %    Platelet Count 108 (L) 150 - 400 K/UL    MPV 8.4 7 - 11 FL   COMPREHENSIVE METABOLIC PANEL    Collection Time: 03/20/17  5:10 AM   Result Value Ref Range    Sodium 139 137 - 147 MMOL/L    Potassium 3.7 3.5 - 5.1 MMOL/L    Chloride 96 (L) 98 - 110 MMOL/L    Glucose 144 (H) 70 - 100 MG/DL    Blood Urea Nitrogen 13 7 - 25 MG/DL    Creatinine 0.45 (H) 0.4 - 1.00 MG/DL    Calcium 8.5 8.5 - 40.9 MG/DL    Total Protein 5.9 (L) 6.0 - 8.0 G/DL    Total Bilirubin 0.5 0.3 - 1.2 MG/DL    Albumin 3.3 (L) 3.5 - 5.0 G/DL    Alk Phosphatase 811 (H) 25 - 110 U/L    AST (SGOT) 16 7 - 40 U/L    CO2 36 (H) 21 - 30 MMOL/L    ALT (SGPT) 6 (L) 7 - 56 U/L    Anion Gap 7 3 - 12    eGFR Non African American 42 (L) >60 mL/min    eGFR African American 51 (L) >60 mL/min   PROTIME INR (PT)    Collection Time: 03/20/17  5:10 AM   Result Value Ref Range    INR 2.0 (H) 0.8 - 1.2   VITAMIN B12 Collection Time: 03/20/17  5:10 AM   Result Value Ref Range    Vitamin B12 1,338 (H) 180 - 914 PG/ML   FOLATE, SERUM    Collection Time: 03/20/17  5:10 AM   Result Value Ref Range    Serum Folate >24.0 >3.9 NG/ML   POC GLUCOSE    Collection Time: 03/20/17  5:15 AM   Result Value Ref Range    Glucose, POC 159 (H) 70 - 100 MG/DL       Point of Care Testing  (Last 24 hours)  Glucose: (!) 144 (03/20/17 0510)  POC Glucose (Download): (!) 159 (03/20/17 0515)    Radiology and other Diagnostics Review:    Pertinent radiology reviewed.    Silvio Clayman, APRN   Pager [phone]

## 2017-03-20 NOTE — Progress Notes
I have reviewed the notes, assessment, and/or procedures performed by Caroline, RN and concur with her documentation unless otherwise noted.

## 2017-03-20 NOTE — Discharge Instructions - Appointments
Please have your labs checked in one week. Dr. Jeannine Kitten will follow these up. Then you have a follow up appointment with him on Sept 6th.

## 2017-03-20 NOTE — Consults
CLINICAL NUTRITION                                                        Clinical Nutrition Education Summary     NAME:Alexis Mcconnell             MRN: [id number]             DOB:1966-03-24          AGE: 51 y.o.  ADMISSION DATE: 03/17/2017             DAYS ADMITTED: LOS: 1 day      Education on Low Sodium Diet was provided.  Topics included the following:   ? Indications for diet  ? Sodium limit of 2,000 milligrams daily  ? Foods recommended/not recommended  ? Sodium content of foods (examples)  ? Reading food labels  ? Food preparation suggestions  ? Sodium-free seasonings  ? Sample meals and snacks  ? Eating out tips  Written materials were provided.    Phone number was given for any further nutrition-related questions.    Learner(s): Patient  Learner's Response:     ??? Verbalized understanding of key dietary guidelines taught.  ??? Verbalized his/her intention to make needed dietary changes.       Comments:  51 year old female with past medical history of cirrhosis of liver, external hemorrhoids comes in with chief complaint of hematochezia for the last 4 days. Consult received for low sodium diet education. Met with patient for  low sodium diet education. Pt reports  good appetite; consuming 3 meals and a snack per day. Pt reports following a diabetic diet at home but does not monitor sodium intake. Educated pt on 2000mg  sodium limit. Pt denies adding salt to food at home. Pt reports going out to eat maybe once a week. Encouraged pt to look up sodium content of food before going out to eat; also suggested splitting meals. Pt denies relying on many processed foods at home but does admit to eating Ramen. Encouraged pt to have a good source of protein with every meal; fresh meat and low fat dairy. When choosing deli meat advised pt to go to deli counter and ask for low sodium options; But warned pt these may still be high sodium foods. Discussed writing down or tracking sodium content to ensure sodium intake is within 2000mg  limit. Gave handout and contact information. No further questions at this time. Anticipate limited compliance.  Appreciate the consult.     Alvin Critchley [facility]  Dietitian Assistant  [phone]

## 2017-03-20 NOTE — Progress Notes
Patient was seen and examined on rounds. Vital signs were reviewed and were stable. Labs were reviewed and were stable. Hemoglobin was 7.9 which is consistent with prior. Creatinine is stable at 1.34. Physical exam was unchanged from yesterday.    She denies any bloody bowel movements today, has had several bowel movements since admission. She does report a little lightheadedness today probably secondary to the amount of diuresis she has had over the last couple days. Decreased Lasix to one dose today. And will transition to oral on discharge. Encouraged her to keep up with oral intake today.    Right lower extremity with persistent erythema. Will start on Keflex for coverage of cellulitis.    She has follow-up appointment with hepatology arranged on September 6. All questions were answered and she was discharged in stable condition.    Greater than 30 minutes were spent in coordination of discharge planning including discussion with case management team, discussion with patient, placement of discharge orders, discussion with hepatology team, and documentation of discharge summary.    Jerran Tappan E. Lovena Le, [place]  Internal [place], [phone]

## 2017-03-24 NOTE — Discharge Instructions - Pharmacy
Physician Discharge Summary      Name: Alexis Mcconnell Alexis Mcconnell  Medical Record Number: [id number]        Account Number:  [id number]  Date Of Birth:  1965/09/05                         Age:  51 years   Admit date:  03/17/2017                     Discharge date:  03/20/2017    Attending Physician:  Alexis Elliot, MD                Service: Med Private [phone]    Physician Summary completed by: Alexis Elliot, MD    Reason for hospitalization: hematochezia, hemorrhoidal bleeding     Significant PMH:   Past Medical History:   Diagnosis Date   ??? Asthma    ??? Disorder of thyroid gland    ??? Liver cirrhosis (HCC)     diagnosed in [place]   ??? Psychiatric illness          Allergies: Citrus fruit [citrus and derivatives]; Heparin; Levaquin [levofloxacin]; Sulfa (sulfonamide antibiotics); Citric acid; Lovenox [enoxaparin]; and Propofol    Admission Physical Exam notable for:    Constitutional: She is oriented to person, place, and time. She appears well-developed and well-nourished. She appears distressed.   HENT:   Head: Normocephalic and atraumatic.   Eyes: Conjunctivae and EOM are normal.   Neck: Neck supple.   Cardiovascular: Normal rate and regular rhythm.    Pulmonary/Chest: Effort normal and breath sounds normal.   Abdominal: Soft. She exhibits distension. There is no tenderness. There is no rebound.   Genitourinary: Rectal exam shows external hemorrhoid. Rectal exam shows no fissure.   Musculoskeletal: Normal range of motion. She exhibits edema.   Neurological: She is alert and oriented to person, place, and time.   Skin: Skin is dry.   Psychiatric: She has a normal mood and affect.       Admission Lab/Radiology studies notable for:   hgb 8.6  plt 119  inr 1.6  Cr 1.31    CT ABD/PEL WO CONT:  FINDINGS:    Evaluation is limited in the absence of IV contrast which includes the   viscera, vasculature, and lymph nodes. Additionally, evaluation is limited   due to large body habitus. Stable developing round atelectasis or scarring in the left lower lobe.   Mild cardiomegaly.    Mild hepatomegaly with lobulated and nodular contour with relative left   lobe hypertrophy compatible with reported cirrhosis. Prior   cholecystectomy. Mild splenomegaly. Diffuse pancreatic volume loss with   coarse calcifications, better visualized on previous exam. Adrenal glands   grossly unremarkable. Mild bilateral renal cortical atrophy. No   hydronephrosis.    Small and large bowel loops normal in caliber. Ileocolonic anastomosis in   the right lower quadrant. Additional bowel anastomosis within the central   upper abdomen, likely between small bowel loops. Gaseous distention of the   redundant sigmoid colon. Mild mesenteric congestion. No significant   ascites. No definite lymphadenopathy. No pneumoperitoneum. The abdominal   aorta and iliac arteries are normal in caliber with minimal calcified   atherosclerosis.    Partially distended unopacified urinary bladder grossly unremarkable.   Prior hysterectomy. Marked diffuse body wall edema and fluid, slightly   greater along the left flank and severely involving the lower pannus.   Partially  healed right superior and inferior pubic ramus fracture   deformities. Lumbar spondylosis. No definite destructive osseous lesions.   Partially calcified fat necrosis posterior to the left sacrum again noted.    IMPRESSION      1. ???Limited examination in the absence of IV contrast and due to patient   body habitus.  2. ???Prior bowel anastomoses without evidence of bowel obstruction or   definite intra-abdominal inflammatory mass.  3. ???Mildly enlarged cirrhotic liver. Portal hypertension with mesenteric   congestion and mild splenomegaly. No significant ascites.  4. ???Marked body wall edema/fluid, greatest along the left flank and lower   aspect of the pannus.  5. ???Atrophic pancreas with coarse calcifications compatible sequelae of chronic calcific pancreatitis, better visualized on previous exam.  6. ???Healing right superior and inferior pubic ramus fracture deformities.    Brief Hospital Course:  The patient was admitted and the following issues were addressed during this hospitalization: (with pertinent details).        Alexis Mcconnell has a prior medical history of cirrhosis, diabetes and hemorrhoids. She was admitted for several episodes of hematochezia causing a 2 gram drop in her baseline hemoglobin.    She had a recent colonoscopy on 6/11 that showed external hemorrhoids. She was started on anusol. Hepatology was consulted. Felt that bleeding was related to hemorrhoids and drop in hgb was related to dilution from volume overload. She was diuresed with IV lasix and was neg neg 10L for the hospital stay. She had several bowel movements without blood and hemoglobin and vitals remained stable.     Her RLE was noted to be red and warm on exam. It did not improve with diuresis and appeared consistent with cellulitis. She was started on keflex with a planned 10 day course.     She has follow-up appointment with hepatology arranged on September 6.      Condition at Discharge: Stable    Discharge Diagnoses:      Hospital Problems        Active Problems    Other cirrhosis of liver (HCC)    Insulin dependent diabetes mellitus (HCC)    Hypothyroidism       Resolved Problems    RESOLVED: Hematochezia    RESOLVED: Bleeding hemorrhoids          Surgical Procedures: None    Significant Diagnostic Studies and Procedures: noted in brief hospital course    Consults:  Hepatology    Patient Disposition: Home       Patient instructions/medications:     Activity as Tolerated   It is important to keep increasing your activity level after you leave the hospital.  Moving around can help prevent blood clots, lung infection (pneumonia) and other problems.  Gradually increasing the number of times you are up moving around will help you return to your normal activity level more quickly.  Continue to increase the number of times you are up to the chair and walking daily to return to your normal activity level. Begin to work toward your normal activity level at discharge     Report These Signs and Symptoms   Please contact your doctor if you have any of the following symptoms: temperature higher than 100 degrees F or blood in your stool.     Questions About Your Stay   For questions or concerns regarding your hospital stay. Call [phone]   Discharging attending physician: Alexis Mcconnell [[id number]]      Low Sodium Diet  You will need to monitor the amount of sodium in your diet. Do not eat more than 2g (grams) or 2000mg  (milligrams) per day.    If you have questions regarding your diet at home, you may contact a dietitian at [phone].        Current Discharge Medication List       START taking these medications    Details   cephalexin (KEFLEX) 500 mg capsule Take one capsule by mouth four times daily.  Qty: 20 capsule, Refills: 0    PRESCRIPTION TYPE:  Normal      hemorrhoidal prep (ANUSOL) 0.25 % rectal suppository Insert or Apply one suppository to rectal area as directed twice daily for 9 days.  Qty: 18 suppository, Refills: 0    PRESCRIPTION TYPE:  Normal          CONTINUE these medications which have been CHANGED or REFILLED    Details   furosemide (LASIX) 20 mg tablet Take two tablets by mouth daily. Take 20mg  by mouth in the morning and take 40mg  in the evening.  Qty: 90 tablet, Refills: 3    PRESCRIPTION TYPE:  No Print      spironolactone (ALDACTONE) 100 mg tablet Take one tablet by mouth daily. Take with food.  Qty: 90 tablet, Refills: 0    PRESCRIPTION TYPE:  Normal          CONTINUE these medications which have NOT CHANGED    Details   acetaminophen (TYLENOL) 325 mg tablet Take 650 mg by mouth every 4 hours as needed for Pain.    PRESCRIPTION TYPE:  Historical Med      albuterol (PROAIR HFA, VENTOLIN HFA, OR PROVENTIL HFA) 90 mcg/actuation inhaler Inhale 2 puffs by mouth into the lungs every 6 hours as needed for Wheezing or Shortness of Breath. Shake well before use.    PRESCRIPTION TYPE:  Historical Med      cholecalciferol(+) (Vitamin D3) 50,000 units capsule Take 1 capsule by mouth three times weekly.  Qty: 12 capsule, Refills: 2    PRESCRIPTION TYPE:  Normal      clonazePAM (KLONOPIN) 0.5 mg tablet Take 0.5 mg by mouth twice daily.    PRESCRIPTION TYPE:  Historical Med      ferrous sulfate (FEOSOL, FEROSUL) 325 mg (65 mg iron) tablet Take 325 mg by mouth three times daily. Take on an empty stomach at least 1 hour before or 2 hours after food.    PRESCRIPTION TYPE:  Historical Med      fluoxetine(+) (PROZAC) 40 mg capsule Take 40 mg by mouth daily.    PRESCRIPTION TYPE:  Historical Med      gabapentin (NEURONTIN) 800 mg tablet Take 800 mg by mouth three times daily.    PRESCRIPTION TYPE:  Historical Med      hydrOXYzine pamoate (VISTARIL) 25 mg capsule Take 25 mg by mouth three times daily as needed for Itching.    PRESCRIPTION TYPE:  Historical Med      insulin aspart U-100 (NOVOLOG FLEXPEN U-100 INSULIN) 100 unit/mL injection PEN Inject 30 Units under the skin three times daily with meals.    PRESCRIPTION TYPE:  Historical Med      insulin detemir (LEVEMIR FLEXPEN SC) Inject 50 Units under the skin twice daily.    PRESCRIPTION TYPE:  Historical Med      lactulose 10 gram/15 mL oral solution Take 30 g by mouth four times daily.    PRESCRIPTION TYPE:  Historical Med  levothyroxine (SYNTHROID) 175 mcg tablet Take 175 mcg by mouth daily 30 minutes before breakfast.    PRESCRIPTION TYPE:  Historical Med      magnesium oxide (MAG-OX) 400 mg tablet Take 400 mg by mouth twice daily.    PRESCRIPTION TYPE:  Historical Med      Menthol (ASPERCREME HEAT) 10 % gel Apply  topically to affected area.    PRESCRIPTION TYPE:  Historical Med      metoclopramide (REGLAN) 10 mg tablet Take 10 mg by mouth twice daily.    PRESCRIPTION TYPE:  Historical Med multivit-iron-FA-calcium-mins (ONE-A-DAY WOMENS FORMULA) 18 mg iron-400 mcg-500 mg Ca tab Take 1 tablet by mouth daily.    PRESCRIPTION TYPE:  Historical Med      ondansetron (ZOFRAN) 8 mg tablet Take 8 mg by mouth three times daily as needed.    PRESCRIPTION TYPE:  Historical Med      oxyCODONE (ROXICODONE, OXY-IR) 5 mg tablet Take 5 mg by mouth daily     PRESCRIPTION TYPE:  Historical Med      pantoprazole DR (PROTONIX) 40 mg tablet Take 1 tablet by mouth twice daily.  Qty: 90 tablet, Refills: 3    PRESCRIPTION TYPE:  Normal      promethazine (PHENERGAN) 25 mg tablet Take 25 mg by mouth every 6 hours as needed for Nausea or Vomiting.    PRESCRIPTION TYPE:  Historical Med      pyridoxine (vitamin B6) (VITAMIN B-6) 100 mg tablet Take 100 mg by mouth daily.    PRESCRIPTION TYPE:  Historical Med      rifAXIMin (XIFAXAN) 550 mg tablet Take 550 mg by mouth twice daily.    PRESCRIPTION TYPE:  Historical Med      sodium chloride (SALINE MIST) 0.65 % nasal spray Apply 1-2 sprays to each nostril as directed as Needed.    PRESCRIPTION TYPE:  Historical Med      valACYclovir (VALTREX) 500 mg tablet Take 500 mg by mouth every 24 hours.    PRESCRIPTION TYPE:  Historical Med      vitamin A 10,000 unit capsule Take 10,000 Units by mouth daily.    PRESCRIPTION TYPE:  Historical Med          The following medications were removed from your list. This list includes medications discontinued this stay and those removed from your prior med list in our system        acetaminophen/chlorpheniramine (CORICIDIN HBP COLD AND FLU PO)        FERROUS GLUCONATE PO        gemfibrozil (LOPID) 600 mg tablet        metFORMIN (GLUCOPHAGE) 500 mg tablet        MULTIVITAMIN PO        PEG 3350-Electrolytes (GOLYTELY) oral solution        polyethylene glycol 3350 (MIRALAX) 17 g packet        sucralfate (CARAFATE) 1 gram tablet        vitamin E 400 unit capsule               Scheduled appointments:    Apr 10, 2017  2:00 PM CDT Return Patient with Rodena Piety, MD  [facility] 1st Fl  [address]  [place] [address]  [phone]    Additional appointment instructions:      Please have your labs checked in one week. Dr. Constance Goltz will follow these up. Then you have a follow  up appointment with him on Sept 6th.                    Pending items needing follow up: none    Signed:  Crista Elliot, MD  03/23/2017      cc:  Primary Care Physician:  Barnet Pall   Verified  Referring physicians:  Self, Referral   Additional provider(s):

## 2017-03-28 NOTE — Progress Notes
Called to prescreen for her EGD - spoke to patient. Prep reviewed over the phone until they where able to repeat back the instructions. Instructions also mailed to her at this time.       Patient: Alexis Mcconnell       Dr. Maryan Char  You are scheduled for an Upper Endoscopy (EGD) on Thursday, September 6th, 2018 at 07:30 AM at [facility] located at [address], [place] [address].  You must arrive at 06:30 AM and check in at Admissions.      UPPER ENDOSCOPY (EGD) PATIENT PREPARATION INSTRUCTIONS   An EGD is a scope examination of your esophagus and stomach.  It can help to diagnose the cause of problems you may be having in those areas.                  A thin, flexible scope with a lens and light will be guided in through your mouth and will be used to examine your esophagus, stomach, and duodenum.  ? We will also give you sedatives through an IV to help you to relax and sleep during the procedure.  ? The test will usually take only about 10-15 minutes.  ? We will keep you in recovery after the test for approximately 30 minutes.  ? The doctor will speak to you before and after the test.  ? If biopsies are taken, you will be notified of lab results by mail in 10-14 days.  ?   To reduce the risk of bleeding, please stop blood thinners such as Aspirin, Ibuprofen, Plavix, Aleve, and Naproxen 7 days before the test.  Stop Coumadin 5 days before the test.  Lovenox injections may be taken as usual through the day before the test.  Do NOT give yourself a Lovenox injection the morning of the test.  If any of these medicines have been ordered by your doctor, you must check with the doctor to make sure it is okay to stop them.      Do not eat any solid foods after midnight the night before the test.     ? You may drink only clear liquids up until 4 hours before your scheduled arrival time.  ? You should not have anything by mouth during the four hours before you are scheduled to arrive. ? Please take only essential medicines the morning of your test with a small sip of water.     On the day of the test, please report at your scheduled time to the Admissions area of the facility.  ?  You will be sedated for the test, so a responsible adult must drive you home (no taxis or buses are permitted).  We require that the driver stay during the time that you are here.  If you do not have a driver, we will be unable to do the test.                                             ? You will be here for 3-4 hours from arrival time.  ? You will not be able to return to work the same day if you have received sedation.  ? Please bring a list of your current medicines and the dosages with you.     The doctor will blow air into your stomach during the  test.  Not all the air can be suctioned back out, so you may feel slight bloating after the procedure.  You may have a mild sore throat for a day or two.  You can return to your normal diet after the numbing medicine wears off (approximately 1 hour after the exam) unless you are otherwise instructed.     If you have any questions, please call [phone] then press option number 3.

## 2017-03-31 ENCOUNTER — Encounter: Admit: 2017-03-31 | Discharge: 2017-03-31 | Payer: MEDICARE

## 2017-03-31 NOTE — Telephone Encounter
Pt LVM requesting to speak with NC regarding upcoming appt on 9/6 as well as questions about Ultrasound procedure.

## 2017-03-31 NOTE — Telephone Encounter
Returned call to pt on home and mobile number.  No answer.  LM for her to call back.

## 2017-04-03 ENCOUNTER — Encounter: Admit: 2017-04-03 | Discharge: 2017-04-03 | Payer: MEDICARE

## 2017-04-03 DIAGNOSIS — K760 Fatty (change of) liver, not elsewhere classified: Principal | ICD-10-CM

## 2017-04-03 LAB — BASIC METABOLIC PANEL
Lab: 1.3 — ABNORMAL HIGH (ref 0.40–1.10)
Lab: 106
Lab: 14
Lab: 140
Lab: 25
Lab: 4.1
Lab: 41 — ABNORMAL LOW
Lab: 49 — ABNORMAL LOW
Lab: 54 — ABNORMAL LOW (ref 60–99)
Lab: 8.8
Lab: 9

## 2017-04-03 NOTE — Telephone Encounter
Returned call to pt instructing her to increase the amount she is taking daily and let us know if she does not have a bowel movement.

## 2017-04-03 NOTE — Telephone Encounter
Pt has not had a BM in 2 days and is taking lactulose.

## 2017-04-08 ENCOUNTER — Encounter: Admit: 2017-04-08 | Discharge: 2017-04-08 | Payer: MEDICARE

## 2017-04-08 NOTE — Telephone Encounter
Alexis Mcconnell from Dr. Darnelle Going office LVM stating pt wishes to have U/S completed there. Please return call and fax orders.

## 2017-04-08 NOTE — Telephone Encounter
Pt called also, she is wanting to speak with you about the [place] as well.

## 2017-04-09 NOTE — Telephone Encounter
Returned call to pt informing her that she does not need to complete the ultrasound scheduled for today.  [place] has been cancelled.

## 2017-04-10 ENCOUNTER — Ambulatory Visit: Admit: 2017-04-10 | Discharge: 2017-04-11 | Payer: MEDICARE

## 2017-04-10 ENCOUNTER — Ambulatory Visit: Admit: 2017-04-10 | Discharge: 2017-04-10 | Payer: MEDICARE

## 2017-04-10 ENCOUNTER — Encounter: Admit: 2017-04-10 | Discharge: 2017-04-10 | Payer: MEDICARE

## 2017-04-10 DIAGNOSIS — I868 Varicose veins of other specified sites: ICD-10-CM

## 2017-04-10 DIAGNOSIS — I8510 Secondary esophageal varices without bleeding: ICD-10-CM

## 2017-04-10 DIAGNOSIS — K3189 Other diseases of stomach and duodenum: ICD-10-CM

## 2017-04-10 DIAGNOSIS — J45909 Unspecified asthma, uncomplicated: Principal | ICD-10-CM

## 2017-04-10 DIAGNOSIS — K766 Portal hypertension: Principal | ICD-10-CM

## 2017-04-10 DIAGNOSIS — Z87891 Personal history of nicotine dependence: ICD-10-CM

## 2017-04-10 DIAGNOSIS — K7581 Nonalcoholic steatohepatitis (NASH): ICD-10-CM

## 2017-04-10 DIAGNOSIS — K209 Esophagitis, unspecified: ICD-10-CM

## 2017-04-10 DIAGNOSIS — K317 Polyp of stomach and duodenum: ICD-10-CM

## 2017-04-10 DIAGNOSIS — E079 Disorder of thyroid, unspecified: ICD-10-CM

## 2017-04-10 DIAGNOSIS — K7460 Unspecified cirrhosis of liver: ICD-10-CM

## 2017-04-10 DIAGNOSIS — Z794 Long term (current) use of insulin: ICD-10-CM

## 2017-04-10 DIAGNOSIS — I8500 Esophageal varices without bleeding: ICD-10-CM

## 2017-04-10 DIAGNOSIS — F99 Mental disorder, not otherwise specified: ICD-10-CM

## 2017-04-10 LAB — COMPREHENSIVE METABOLIC PANEL
Lab: 0.5 mg/dL (ref 0.3–1.2)
Lab: 132 MMOL/L — ABNORMAL LOW (ref 137–147)
Lab: 17 mg/dL (ref 7–25)
Lab: 182 U/L — ABNORMAL HIGH (ref 25–110)
Lab: 21 U/L (ref 7–56)
Lab: 26 MMOL/L (ref 21–30)
Lab: 3.7 g/dL (ref 3.5–5.0)
Lab: 34 U/L (ref 7–40)
Lab: 4.5 MMOL/L — ABNORMAL LOW (ref 3.5–5.1)
Lab: 42 mL/min — ABNORMAL LOW (ref >60)
Lab: 448 mg/dL — ABNORMAL HIGH (ref 70–100)
Lab: 50 mL/min — ABNORMAL LOW (ref >60)
Lab: 7 K/UL (ref 3–12)
Lab: 8.9 mg/dL — ABNORMAL LOW (ref 8.5–10.6)

## 2017-04-10 LAB — CBC AND DIFF
Lab: 0 10*3/uL (ref 0–0.20)
Lab: 2.9 M/UL — ABNORMAL LOW (ref 4.0–5.0)
Lab: 9.8 10*3/uL (ref 4.5–11.0)

## 2017-04-10 LAB — PROTIME INR (PT): Lab: 1.1 MMOL/L (ref 0.8–1.2)

## 2017-04-10 LAB — POC GLUCOSE: Lab: 167 mg/dL — ABNORMAL HIGH (ref 70–100)

## 2017-04-10 MED ORDER — LIDOCAINE (PF) 200 MG/10 ML (2 %) IJ SYRG
0 refills | Status: DC
Start: 2017-04-10 — End: 2017-04-10
  Administered 2017-04-10: 13:00:00 120 mg via INTRAVENOUS

## 2017-04-10 MED ORDER — KETAMINE 10 MG/ML IJ SOLN
0 refills | Status: DC
Start: 2017-04-10 — End: 2017-04-10
  Administered 2017-04-10: 13:00:00 30 mg via INTRAVENOUS

## 2017-04-10 MED ORDER — FENTANYL CITRATE (PF) 50 MCG/ML IJ SOLN
0 refills | Status: DC
Start: 2017-04-10 — End: 2017-04-10
  Administered 2017-04-10 (×3): 50 ug via INTRAVENOUS

## 2017-04-10 MED ORDER — PEG 3350-ELECTROLYTES 236-22.74-6.74 -5.86 GRAM PO SOLR
0 refills | 1.00000 days | Status: AC
Start: 2017-04-10 — End: 2017-10-09

## 2017-04-10 MED ORDER — MIDAZOLAM 1 MG/ML IJ SOLN
INTRAVENOUS | 0 refills | Status: DC
Start: 2017-04-10 — End: 2017-04-10
  Administered 2017-04-10: 13:00:00 2 mg via INTRAVENOUS

## 2017-04-10 MED ORDER — LACTATED RINGERS IV SOLP
0 refills | Status: DC
Start: 2017-04-10 — End: 2017-04-10
  Administered 2017-04-10: 12:00:00 via INTRAVENOUS

## 2017-04-10 NOTE — H&P (View-Only)
Pre Procedure History and Physical/Sedation Plan    Name:Alexis Mcconnell                                                                   MRN: [id number]                 DOB:02/16/1966          Age: 51 y.o.  Date of Service: 04/10/2017    Date of Procedure:  04/10/2017    Planned Procedure(s):  GI:  EGD  Sedation/Medication Plan: MAC (Monitored Anesthesia Care)  Discussion/Reviews:  Physician has discussed risks and alternatives of this type of sedation and above planned procedures with patient  ___________________________________________________________________  Chief Complaint:  LA grade C esophagitis, 2 columns of small varices    History of Present Illness: Alexis Mcconnell is a 51 y.o. female with history of NASH cirrhosis now here for a follow up EGD for her LA grade C esophagitis. 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 She mentions history of stretching of the esophagus x 3 in the past.    Previous Anesthetic/Sedation History:  MAC    Past Medical History:   Diagnosis Date   ??? Asthma    ??? Disorder of thyroid gland    ??? Liver cirrhosis (HCC)     diagnosed in [place]   ??? Psychiatric illness      Past Surgical History:   Procedure Laterality Date   ??? ESOPHAGEAL DILATATION  2015    x2   ??? UPPER GASTROINTESTINAL ENDOSCOPY N/A 01/10/2017    ESOPHAGOGASTRODUODENOSCOPY performed by Dawna Part, MD at ENDO/GI   ??? COLONOSCOPY N/A 01/10/2017    COLONOSCOPY performed by Dawna Part, MD at ENDO[facility]GI   ??? UPPER GASTROINTESTINAL ENDOSCOPY  01/10/2017    ESOPHAGOGASTRODUODENOSCOPY EXCISION LESION performed by Dawna Part, MD at ENDO/GI   ??? COLONOSCOPY  01/10/2017    COLONOSCOPY EXCISION LESION performed by Dawna Part, MD at ENDO/GI   ??? ABDOMEN SURGERY     ??? HX CHOLECYSTECTOMY     ??? HX HYSTERECTOMY         No family history on file.  Social History   Substance Use Topics   ??? Smoking status: Former Smoker     Quit date: 03/18/1998 ??? Smokeless tobacco: Never Used   ??? Alcohol use No     History   Drug Use No     Allergies:  Citrus fruit [citrus and derivatives]; Heparin; Levaquin [levofloxacin]; Sulfa (sulfonamide antibiotics); Citric acid; Lovenox [enoxaparin]; and Propofol  Medications  No current facility-administered medications for this encounter.      Review of Systems:  All other systems reviewed and are negative.           Physical Exam:     General appearance: alert  Throat: Lips, mucosa, and tongue normal. Teeth and gums normal  Lungs: clear to auscultation bilaterally  Heart: regular rate and rhythm, S1, S2 normal, no murmur, click, rub or gallop  Abdomen: soft, non-tender. Bowel sounds normal. No masses,  no organomegaly Distended.   Extremities: edema 2+ pitting bilateral lower extremities to mid-calf  @  Airway:  Per  anesthesia  Anesthesia Classification:  ASA III (A patient with a severe systemic disease that limits activity, but is not incapacitating)  NPO Status: Acceptable  Pregnancy Status: Not Pregnant    Lab/Radiology/Other Diagnostic Tests  Labs:  Relevant labs reviewed    Kathrynn Ducking, MD  Pager [phone]

## 2017-04-10 NOTE — Anesthesia Post-Procedure Evaluation
Post-Anesthesia Evaluation    Name: Alexis Mcconnell      MRN: [id number]     DOB: [birth year]     Age: 51 y.o.     Sex: female   __________________________________________________________________________     Procedure Date: 04/10/2017  Procedure: Procedure(s):  ESOPHAGOGASTRODUODENOSCOPY  ESOPHAGOGASTRODUODENOSCOPY BIOPSY      Surgeon: Surgeon(s):  Sherilyn Banker, MD    Post-Anesthesia Vitals         Post Anesthesia Evaluation Note    Evaluation location: Pre/Post  Patient participation: recovered; patient participated in evaluation  Level of consciousness: alert    Pain score: 0  Pain management: adequate    Hydration: normovolemia  Temperature: 36.0C - 38.4C  Airway patency: adequate    Perioperative Events  Perioperative events:  no       Post-op nausea and vomiting: no PONV    Postoperative Status  Cardiovascular status: hemodynamically stable  Respiratory status: spontaneous ventilation  Follow-up needed: none        Perioperative Events  Perioperative Event: No  Emergency Case Activation: No

## 2017-04-10 NOTE — Discharge Planning (AHS/AVS)
EGD/Upper EUS/ERCP/Antegrade Enteroscopy  Post Upper Endoscopy Instructions      -You may have a sore throat after the procedure for 2-3 days.  Try sucrets or lozenges to help ease the pain.  If it continues please contact us.    -If you feel feverish, have a temperature of 101 degrees or higher, persistent nausea and vomiting, abdominal pain or dark stools; please notify your nurse or GI physician.    -You may have abdominal cramping following the procedure this can be relieved by belching or passing air.    -If you have redness or swelling at the IV site, place a warm, wet washcloth over the affected areas for 15 minutes, 3-4 times a day until the redness subsides.  If symptoms continue for 2-3 days, contact your regular physician.    - If you have bleeding from your mouth, over 2 tablespoons and increasing, please notify your physician.  A small amount of bleeding is normal if a biopsy or polyps were taken.  If you are vomiting blood you need to seek immediate medical attention.    - You may resume all your routine medications, if medications need to be held your physician and/or nurse will notify you post procedure.    SPECIFIC INSTRUCTIONS  INPATIENTS:  Ask for help when you get up in your room, as you may still be drowsy from your sedation.    OUTPATIENTS:  A. Because of sedation and lack of coordination, UNTIL TOMORROW, DO NOT:  1. Operate any motorized vehicle - this includes driving.  2. Sign any legal documents or conduct important business matters.  3. Use any dangerous machinery (chain saw, lawnmower, etc.).  4. Drink any alcoholic beverages.  Should you have any questions or concerns after your procedure please call [phone] M-F 8am-5:00 pm. After 5:00 pm, holidays or weekends call [phone] and ask for the GI Doctor on call.

## 2017-04-11 ENCOUNTER — Encounter: Admit: 2017-04-11 | Discharge: 2017-04-11 | Payer: MEDICARE

## 2017-04-11 LAB — 25-OH VITAMIN D (D2 + D3): Lab: 21 ng/mL — ABNORMAL LOW (ref 30–80)

## 2017-04-11 NOTE — Telephone Encounter
Pt called requesting her lab results be sent to Dr. Ginger Carne office in [place]. FAX: [phone]

## 2017-04-14 ENCOUNTER — Encounter: Admit: 2017-04-14 | Discharge: 2017-04-14 | Payer: MEDICARE

## 2017-04-14 DIAGNOSIS — K7460 Unspecified cirrhosis of liver: ICD-10-CM

## 2017-04-14 DIAGNOSIS — F99 Mental disorder, not otherwise specified: ICD-10-CM

## 2017-04-14 DIAGNOSIS — E079 Disorder of thyroid, unspecified: ICD-10-CM

## 2017-04-14 DIAGNOSIS — J45909 Unspecified asthma, uncomplicated: Principal | ICD-10-CM

## 2017-04-15 LAB — VITAMIN A: Lab: 6.1 g/dL — ABNORMAL LOW (ref 6.0–8.0)

## 2017-04-15 MED ORDER — VITAMIN A 10,000 UNIT PO CAP
10000 [IU] | ORAL_CAPSULE | Freq: Every day | ORAL | 2 refills | Status: AC
Start: 2017-04-15 — End: 2017-10-09

## 2017-04-18 MED ORDER — CHOLECALCIFEROL (VITAMIN D3) 50,000 UNIT PO CAP
50000 [IU] | ORAL_CAPSULE | ORAL | 2 refills | 84.00000 days | Status: AC
Start: 2017-04-18 — End: 2017-10-09

## 2017-04-21 ENCOUNTER — Encounter: Admit: 2017-04-21 | Discharge: 2017-04-21 | Payer: MEDICARE

## 2017-04-21 NOTE — Telephone Encounter
Pt returned call to [place] and wanted NC to know that she did have a bowel movement.

## 2017-05-16 ENCOUNTER — Encounter: Admit: 2017-05-16 | Discharge: 2017-05-16 | Payer: MEDICARE

## 2017-05-16 MED ORDER — SPIRONOLACTONE 100 MG PO TAB
100 mg | ORAL_TABLET | Freq: Every day | ORAL | 3 refills | 46.00000 days | Status: AC
Start: 2017-05-16 — End: 2017-10-09

## 2017-05-16 NOTE — Telephone Encounter
Pt needs refill of spironolactone.

## 2017-05-16 NOTE — Telephone Encounter
Refilled spironolactone script sent to [facility] on file.

## 2017-06-30 ENCOUNTER — Encounter: Admit: 2017-06-30 | Discharge: 2017-06-30 | Payer: MEDICARE

## 2017-06-30 NOTE — Telephone Encounter
EGD report faxed to [facility].

## 2017-06-30 NOTE — Telephone Encounter
Pt left vm needing copy of EGD results sent to [facility] [phone] Attn: Brynda Peon. Please call pt back.

## 2017-07-08 ENCOUNTER — Encounter: Admit: 2017-07-08 | Discharge: 2017-07-08 | Payer: MEDICARE

## 2017-07-08 NOTE — Telephone Encounter
Alexis Mcconnell left vm because pt needs a left heart cath for abnormal stress test and chest pains. Scheduled for 07/15/17. Dr. Nelva Bush wanting to make sure ok for pt to get Heparin and anti-platelet agent and aspirin since pt has cirrhosis. Pt also stated she was taken off aspirin and has allergy to heparin. Wanting Dr. Richardson Landry opinion. Please return call to [place].

## 2017-07-09 NOTE — Telephone Encounter
Please return a call to [facility] at Dr. Nelva Bush' office.

## 2017-07-15 ENCOUNTER — Encounter: Admit: 2017-07-15 | Discharge: 2017-07-15 | Payer: MEDICARE

## 2017-07-15 DIAGNOSIS — E559 Vitamin D deficiency, unspecified: Principal | ICD-10-CM

## 2017-09-05 ENCOUNTER — Encounter: Admit: 2017-09-05 | Discharge: 2017-09-05 | Payer: MEDICARE

## 2017-09-05 DIAGNOSIS — K7469 Other cirrhosis of liver: Principal | ICD-10-CM

## 2017-09-18 ENCOUNTER — Encounter: Admit: 2017-09-18 | Discharge: 2017-09-18 | Payer: MEDICARE

## 2017-09-18 DIAGNOSIS — K7469 Other cirrhosis of liver: Principal | ICD-10-CM

## 2017-09-18 LAB — COMPREHENSIVE METABOLIC PANEL
Lab: 0.4
Lab: 21
Lab: 32
Lab: 1.8 — ABNORMAL HIGH
Lab: 30 — ABNORMAL LOW
Lab: 36 — ABNORMAL LOW
Lab: 63
Lab: 7.3
Lab: 8
Lab: 8.5 — ABNORMAL LOW (ref 5.0–8.0)

## 2017-09-18 LAB — CBC AND DIFF
Lab: 0 % (ref 0–2)
Lab: 0 K/UL (ref 0–0.20)
Lab: 0.6 10*3/uL — ABNORMAL HIGH (ref 0–0.45)
Lab: 11 % — ABNORMAL LOW (ref 40–50)
Lab: 128 FL — ABNORMAL LOW (ref 7–11)
Lab: 14 K/UL (ref 150–400)
Lab: 29 % — ABNORMAL LOW (ref 24–44)
Lab: 3.6 g/dL — ABNORMAL LOW (ref 13.5–16.5)
Lab: 31 g/dL — ABNORMAL HIGH (ref 32.0–36.0)
Lab: 34 % (ref 11–15)
Lab: 34 FL — ABNORMAL LOW (ref 80–100)
Lab: 4 % (ref 4–12)
Lab: 59 % (ref 41–77)
Lab: 6 % — ABNORMAL HIGH (ref 0–5)
Lab: 6.3 10*3/uL (ref 1.8–7.0)
Lab: 93 pg (ref 26–34)

## 2017-09-18 LAB — PROTIME INR (PT)
Lab: 1 K/UL (ref 0–0.80)
Lab: 11 K/UL (ref 1.0–4.8)

## 2017-09-18 LAB — ALPHA FETO PROTEIN (AFP): Lab: 1.7

## 2017-10-09 ENCOUNTER — Ambulatory Visit: Admit: 2017-10-09 | Discharge: 2017-10-09 | Payer: MEDICARE

## 2017-10-09 ENCOUNTER — Encounter: Admit: 2017-10-09 | Discharge: 2017-10-09 | Payer: MEDICARE

## 2017-10-09 DIAGNOSIS — K7469 Other cirrhosis of liver: Principal | ICD-10-CM

## 2017-10-09 DIAGNOSIS — K7460 Unspecified cirrhosis of liver: ICD-10-CM

## 2017-10-09 DIAGNOSIS — J45909 Unspecified asthma, uncomplicated: Principal | ICD-10-CM

## 2017-10-09 DIAGNOSIS — F99 Mental disorder, not otherwise specified: ICD-10-CM

## 2017-10-09 DIAGNOSIS — E079 Disorder of thyroid, unspecified: ICD-10-CM

## 2017-10-09 LAB — CBC AND DIFF
Lab: 0 % — ABNORMAL LOW (ref >60)
Lab: 0 10*3/uL (ref 0–0.20)
Lab: 0.6 10*3/uL (ref 0–0.80)
Lab: 0.7 10*3/uL — ABNORMAL HIGH (ref 0–0.45)
Lab: 11 g/dL — ABNORMAL LOW (ref 12.0–15.0)
Lab: 14 % (ref 11–15)
Lab: 175 K/UL — ABNORMAL HIGH (ref 150–400)
Lab: 2.7 10*3/uL (ref 1.0–4.8)
Lab: 27 % (ref 24–44)
Lab: 3.6 M/UL — ABNORMAL LOW (ref 4.0–5.0)
Lab: 31 pg (ref 26–34)
Lab: 33 % — ABNORMAL LOW (ref 36–45)
Lab: 33 g/dL (ref 32.0–36.0)
Lab: 5.9 10*3/uL (ref 1.8–7.0)
Lab: 6 % (ref 4–12)
Lab: 60 % (ref 41–77)
Lab: 7 % — ABNORMAL HIGH (ref >60)
Lab: 7.6 FL (ref 7–11)
Lab: 9.9 K/UL (ref 4.5–11.0)
Lab: 93 FL (ref 80–100)

## 2017-10-09 LAB — COMPREHENSIVE METABOLIC PANEL
Lab: 139 MMOL/L (ref 137–147)
Lab: 3.9 MMOL/L (ref 3.5–5.1)

## 2017-10-09 LAB — FERRITIN: Lab: 149 ng/mL (ref 10–200)

## 2017-10-09 LAB — PROTIME INR (PT): Lab: 1.1 (ref 0.8–1.2)

## 2017-10-09 LAB — ALPHA FETO PROTEIN (AFP): Lab: 1.6 ng/mL (ref 0.0–15.0)

## 2017-10-09 LAB — 25-OH VITAMIN D (D2 + D3): Lab: 27 ng/mL — ABNORMAL LOW (ref 30–80)

## 2017-10-10 ENCOUNTER — Encounter: Admit: 2017-10-10 | Discharge: 2017-10-10 | Payer: MEDICARE

## 2017-10-10 DIAGNOSIS — F99 Mental disorder, not otherwise specified: ICD-10-CM

## 2017-10-10 DIAGNOSIS — K7460 Unspecified cirrhosis of liver: ICD-10-CM

## 2017-10-10 DIAGNOSIS — J45909 Unspecified asthma, uncomplicated: Principal | ICD-10-CM

## 2017-10-10 DIAGNOSIS — E079 Disorder of thyroid, unspecified: ICD-10-CM

## 2017-10-14 ENCOUNTER — Encounter: Admit: 2017-10-14 | Discharge: 2017-10-14 | Payer: MEDICARE

## 2017-10-14 LAB — VITAMIN A: Lab: 9.3 — ABNORMAL LOW

## 2017-10-23 ENCOUNTER — Encounter: Admit: 2017-10-23 | Discharge: 2017-10-23 | Payer: MEDICARE

## 2017-10-23 DIAGNOSIS — R1011 Right upper quadrant pain: Secondary | ICD-10-CM

## 2017-10-23 DIAGNOSIS — E079 Disorder of thyroid, unspecified: ICD-10-CM

## 2017-10-23 DIAGNOSIS — F99 Mental disorder, not otherwise specified: ICD-10-CM

## 2017-10-23 DIAGNOSIS — K7460 Unspecified cirrhosis of liver: ICD-10-CM

## 2017-10-23 DIAGNOSIS — J45909 Unspecified asthma, uncomplicated: Principal | ICD-10-CM

## 2017-10-23 MED ORDER — MORPHINE 4 MG/ML IV CRTG
4 mg | Freq: Once | INTRAVENOUS | 0 refills | Status: CP
Start: 2017-10-23 — End: ?
  Administered 2017-10-24: 05:00:00 4 mg via INTRAVENOUS

## 2017-10-24 ENCOUNTER — Encounter: Admit: 2017-10-24 | Discharge: 2017-10-24 | Payer: MEDICARE

## 2017-10-24 ENCOUNTER — Observation Stay: Admit: 2017-10-24 | Discharge: 2017-10-24 | Payer: MEDICARE

## 2017-10-24 DIAGNOSIS — K7460 Unspecified cirrhosis of liver: ICD-10-CM

## 2017-10-24 DIAGNOSIS — E079 Disorder of thyroid, unspecified: ICD-10-CM

## 2017-10-24 DIAGNOSIS — R1011 Right upper quadrant pain: ICD-10-CM

## 2017-10-24 DIAGNOSIS — F99 Mental disorder, not otherwise specified: ICD-10-CM

## 2017-10-24 DIAGNOSIS — J45909 Unspecified asthma, uncomplicated: Principal | ICD-10-CM

## 2017-10-24 LAB — POC GLUCOSE
Lab: 195 mg/dL — ABNORMAL HIGH (ref 70–100)
Lab: 204 mg/dL — ABNORMAL HIGH (ref 70–100)
Lab: 172 mg/dL — ABNORMAL HIGH (ref 70–100)

## 2017-10-24 LAB — MAGNESIUM: Lab: 2.2 mg/dL (ref 1.6–2.6)

## 2017-10-24 LAB — URINALYSIS DIPSTICK
Lab: NEGATIVE MMOL/L (ref 21–30)
Lab: NEGATIVE U/L — ABNORMAL HIGH (ref 25–110)
Lab: NEGATIVE g/dL — ABNORMAL LOW (ref 3.5–5.0)

## 2017-10-24 LAB — URINALYSIS, MICROSCOPIC

## 2017-10-24 LAB — LIPASE: Lab: <3 U/L — ABNORMAL LOW (ref 11–82)

## 2017-10-24 LAB — COMPREHENSIVE METABOLIC PANEL: Lab: 139 MMOL/L (ref 137–147)

## 2017-10-24 LAB — CBC AND DIFF
Lab: 4.1 M/UL (ref 4.0–5.0)
Lab: 8.7 10*3/uL (ref 4.5–11.0)

## 2017-10-24 LAB — TSH WITH FREE T4 REFLEX: Lab: 0.4 uU/mL (ref 0.35–5.00)

## 2017-10-24 LAB — GGTP: Lab: 35 U/L (ref 9–64)

## 2017-10-24 MED ORDER — LACTATED RINGERS IV SOLP
500 mL | Freq: Once | INTRAVENOUS | 0 refills | Status: CP
Start: 2017-10-24 — End: ?
  Administered 2017-10-24: 06:00:00 500 mL via INTRAVENOUS

## 2017-10-24 MED ORDER — ONDANSETRON HCL (PF) 4 MG/2 ML IJ SOLN
4 mg | INTRAVENOUS | 0 refills | Status: DC | PRN
Start: 2017-10-24 — End: 2017-10-29
  Administered 2017-10-24 – 2017-10-29 (×9): 4 mg via INTRAVENOUS

## 2017-10-24 MED ORDER — RIFAXIMIN 550 MG PO TAB
550 mg | Freq: Two times a day (BID) | ORAL | 0 refills | Status: DC
Start: 2017-10-24 — End: 2017-11-02
  Administered 2017-10-24 – 2017-11-02 (×18): 550 mg via ORAL

## 2017-10-24 MED ORDER — SODIUM CHLORIDE 0.65 % NA SPRA
1-2 | NASAL | 0 refills | Status: DC | PRN
Start: 2017-10-24 — End: 2017-11-02
  Administered 2017-10-24: 18:00:00 2 via NASAL

## 2017-10-24 MED ORDER — MORPHINE 4 MG/ML IV CRTG
2-4 mg | INTRAVENOUS | 0 refills | Status: DC | PRN
Start: 2017-10-24 — End: 2017-10-24

## 2017-10-24 MED ORDER — INSULIN ASPART 100 UNIT/ML SC FLEXPEN
0-6 [IU] | Freq: Before meals | SUBCUTANEOUS | 0 refills | Status: DC
Start: 2017-10-24 — End: 2017-11-02

## 2017-10-24 MED ORDER — VALACYCLOVIR 500 MG PO TAB
500 mg | ORAL | 0 refills | Status: DC
Start: 2017-10-24 — End: 2017-11-02
  Administered 2017-10-24 – 2017-11-02 (×10): 500 mg via ORAL

## 2017-10-24 MED ORDER — IOHEXOL 350 MG IODINE/ML IV SOLN
80 mL | Freq: Once | INTRAVENOUS | 0 refills | Status: CP
Start: 2017-10-24 — End: ?
  Administered 2017-10-24: 06:00:00 80 mL via INTRAVENOUS

## 2017-10-24 MED ORDER — FONDAPARINUX 2.5 MG/0.5 ML SC SYRG
2.5 mg | Freq: Every day | SUBCUTANEOUS | 0 refills | Status: DC
Start: 2017-10-24 — End: 2017-10-28
  Administered 2017-10-24 – 2017-10-27 (×4): 2.5 mg via SUBCUTANEOUS

## 2017-10-24 MED ORDER — GABAPENTIN 400 MG PO CAP
800 mg | Freq: Three times a day (TID) | ORAL | 0 refills | Status: DC
Start: 2017-10-24 — End: 2017-11-02
  Administered 2017-10-24 – 2017-11-02 (×45): 800 mg via ORAL

## 2017-10-24 MED ORDER — OXYCODONE 5 MG PO TAB
5 mg | Freq: Every day | ORAL | 0 refills | Status: DC
Start: 2017-10-24 — End: 2017-10-26
  Administered 2017-10-24 – 2017-10-26 (×3): 5 mg via ORAL

## 2017-10-24 MED ORDER — HYDROXYZINE HCL 25 MG PO TAB
25 mg | Freq: Three times a day (TID) | ORAL | 0 refills | Status: DC | PRN
Start: 2017-10-24 — End: 2017-10-25
  Administered 2017-10-24 – 2017-10-25 (×5): 25 mg via ORAL

## 2017-10-24 MED ORDER — ACETAMINOPHEN/LIDOCAINE/ANTACID DS(#) 1:1:3  PO SUSP
30 mL | Freq: Once | ORAL | 0 refills | Status: CP
Start: 2017-10-24 — End: ?
  Administered 2017-10-24: 07:00:00 30 mL via ORAL

## 2017-10-24 MED ORDER — FLUOXETINE 20 MG PO CAP
40 mg | Freq: Every day | ORAL | 0 refills | Status: DC
Start: 2017-10-24 — End: 2017-11-02
  Administered 2017-10-24 – 2017-11-02 (×10): 40 mg via ORAL

## 2017-10-24 MED ORDER — ACETAMINOPHEN 325 MG PO TAB
650 mg | ORAL | 0 refills | Status: DC | PRN
Start: 2017-10-24 — End: 2017-11-02
  Administered 2017-10-24 – 2017-11-02 (×8): 650 mg via ORAL

## 2017-10-24 MED ORDER — CALCIUM CITRATE-VITAMIN D3 315-200 MG-UNIT PO TAB
Freq: Three times a day (TID) | ORAL | 0 refills | Status: DC
Start: 2017-10-24 — End: 2017-11-02

## 2017-10-24 MED ORDER — DIPHENHYDRAMINE HCL 25 MG PO CAP
25 mg | Freq: Once | ORAL | 0 refills | Status: CP
Start: 2017-10-24 — End: ?
  Administered 2017-10-25: 05:00:00 25 mg via ORAL

## 2017-10-24 MED ORDER — MORPHINE 4 MG/ML IV CRTG
2 mg | INTRAVENOUS | 0 refills | Status: DC | PRN
Start: 2017-10-24 — End: 2017-10-26
  Administered 2017-10-24 – 2017-10-26 (×8): 2 mg via INTRAVENOUS

## 2017-10-24 MED ORDER — LEVOTHYROXINE 175 MCG PO TAB
175 ug | Freq: Every day | ORAL | 0 refills | Status: DC
Start: 2017-10-24 — End: 2017-11-02
  Administered 2017-10-24 – 2017-11-02 (×10): 175 ug via ORAL

## 2017-10-24 MED ORDER — HELP MEDICATION
Freq: Every day | ORAL | 0 refills | Status: DC
Start: 2017-10-24 — End: 2017-10-24

## 2017-10-24 MED ORDER — MORPHINE 4 MG/ML IV CRTG
4 mg | Freq: Once | INTRAVENOUS | 0 refills | Status: CP
Start: 2017-10-24 — End: ?
  Administered 2017-10-24: 08:00:00 4 mg via INTRAVENOUS

## 2017-10-24 MED ORDER — CLONAZEPAM 0.5 MG PO TAB
.5 mg | Freq: Two times a day (BID) | ORAL | 0 refills | Status: DC
Start: 2017-10-24 — End: 2017-11-02
  Administered 2017-10-24 – 2017-11-02 (×17): 0.5 mg via ORAL

## 2017-10-24 MED ORDER — CALCIUM CITRATE 200 MG (950 MG) PO TAB
950 mg | Freq: Three times a day (TID) | ORAL | 0 refills | Status: DC
Start: 2017-10-24 — End: 2017-10-25
  Administered 2017-10-24 – 2017-10-25 (×4): 950 mg via ORAL

## 2017-10-24 MED ORDER — OTHER MEDICATION
1 | Freq: Every day | ORAL | 0 refills | Status: DC
Start: 2017-10-24 — End: 2017-11-02

## 2017-10-24 MED ORDER — INSULIN ASPART 100 UNIT/ML SC FLEXPEN
10 [IU] | Freq: Three times a day (TID) | SUBCUTANEOUS | 0 refills | Status: DC
Start: 2017-10-24 — End: 2017-11-02
  Administered 2017-10-24: 18:00:00 10 [IU] via SUBCUTANEOUS

## 2017-10-24 MED ORDER — INSULIN GLARGINE 100 UNIT/ML (3 ML) SC INJ PEN
40 [IU] | Freq: Every evening | SUBCUTANEOUS | 0 refills | Status: DC
Start: 2017-10-24 — End: 2017-10-30
  Administered 2017-10-25 – 2017-10-26 (×2): 40 [IU] via SUBCUTANEOUS

## 2017-10-24 MED ORDER — ENOXAPARIN 40 MG/0.4 ML SC SYRG
40 mg | Freq: Every day | SUBCUTANEOUS | 0 refills | Status: DC
Start: 2017-10-24 — End: 2017-10-24

## 2017-10-24 MED ORDER — ONDANSETRON HCL (PF) 4 MG/2 ML IJ SOLN
4 mg | Freq: Once | INTRAVENOUS | 0 refills | Status: CP
Start: 2017-10-24 — End: ?
  Administered 2017-10-24: 06:00:00 4 mg via INTRAVENOUS

## 2017-10-24 MED ORDER — LACTATED RINGERS IV SOLP
1000 mL | Freq: Once | INTRAVENOUS | 0 refills | Status: CP
Start: 2017-10-24 — End: ?
  Administered 2017-10-24: 10:00:00 1000 mL via INTRAVENOUS

## 2017-10-24 MED ORDER — SODIUM CHLORIDE 0.9 % IJ SOLN
50 mL | Freq: Once | INTRAVENOUS | 0 refills | Status: CP
Start: 2017-10-24 — End: ?
  Administered 2017-10-24: 06:00:00 50 mL via INTRAVENOUS

## 2017-10-24 MED ORDER — LACTULOSE 10 GRAM/15 ML PO SOLN
30 g | Freq: Four times a day (QID) | ORAL | 0 refills | Status: DC
Start: 2017-10-24 — End: 2017-10-29
  Administered 2017-10-24 – 2017-10-29 (×21): 30 g via ORAL

## 2017-10-25 LAB — POC GLUCOSE
Lab: 164 mg/dL — ABNORMAL HIGH (ref 70–100)
Lab: 171 mg/dL — ABNORMAL HIGH (ref 70–100)
Lab: 146 mg/dL — ABNORMAL HIGH (ref 70–100)
Lab: 156 mg/dL — ABNORMAL HIGH (ref 70–100)

## 2017-10-25 LAB — COMPREHENSIVE METABOLIC PANEL
Lab: 139 MMOL/L — ABNORMAL LOW (ref >60)
Lab: 15 mg/dL — ABNORMAL HIGH (ref 7–25)
Lab: 3.8 MMOL/L — ABNORMAL LOW (ref >60)

## 2017-10-25 LAB — CBC AND DIFF: Lab: 9.6 10*3/uL — ABNORMAL LOW (ref 4.5–11.0)

## 2017-10-25 LAB — PROTIME INR (PT): Lab: 1 mg/dL — ABNORMAL HIGH (ref 0.8–1.2)

## 2017-10-25 MED ORDER — SODIUM CHLORIDE 0.9 % IV SOLP
1000 mL | Freq: Once | INTRAVENOUS | 0 refills | Status: CP
Start: 2017-10-25 — End: ?
  Administered 2017-10-25: 18:00:00 1000 mL via INTRAVENOUS

## 2017-10-25 MED ORDER — DIPHENHYDRAMINE HCL 25 MG PO CAP
25-50 mg | ORAL | 0 refills | Status: DC | PRN
Start: 2017-10-25 — End: 2017-10-26
  Administered 2017-10-25 – 2017-10-26 (×2): 25 mg via ORAL

## 2017-10-26 LAB — COMPREHENSIVE METABOLIC PANEL
Lab: 104 MMOL/L — ABNORMAL LOW (ref 98–110)
Lab: 138 MMOL/L — ABNORMAL HIGH (ref >60)

## 2017-10-26 LAB — POC GLUCOSE
Lab: 185 mg/dL — ABNORMAL HIGH (ref 70–100)
Lab: 168 mg/dL — ABNORMAL HIGH (ref 70–100)
Lab: 193 mg/dL — ABNORMAL HIGH (ref 70–100)
Lab: 177 mg/dL — ABNORMAL HIGH (ref 70–100)

## 2017-10-26 LAB — PROTIME INR (PT): Lab: 1.1 M/UL — ABNORMAL LOW (ref >60)

## 2017-10-26 LAB — CBC AND DIFF: Lab: 8.2 K/UL — ABNORMAL HIGH (ref >60)

## 2017-10-26 MED ORDER — HYDROXYZINE HCL 25 MG PO TAB
25-50 mg | ORAL | 0 refills | Status: DC | PRN
Start: 2017-10-26 — End: 2017-10-29
  Administered 2017-10-26: 25 mg via ORAL
  Administered 2017-10-27: 08:00:00 50 mg via ORAL
  Administered 2017-10-27: 16:00:00 25 mg via ORAL
  Administered 2017-10-27 (×2): 50 mg via ORAL
  Administered 2017-10-28: 15:00:00 25 mg via ORAL
  Administered 2017-10-28 – 2017-10-29 (×4): 50 mg via ORAL

## 2017-10-26 MED ORDER — OXYCODONE 5 MG PO TAB
5-10 mg | ORAL | 0 refills | Status: DC | PRN
Start: 2017-10-26 — End: 2017-10-27
  Administered 2017-10-26: 20:00:00 5 mg via ORAL
  Administered 2017-10-27 (×3): 10 mg via ORAL

## 2017-10-26 MED ORDER — PANTOPRAZOLE 40 MG IV SOLR
40 mg | Freq: Every day | INTRAVENOUS | 0 refills | Status: DC
Start: 2017-10-26 — End: 2017-11-02
  Administered 2017-10-27 – 2017-11-02 (×8): 40 mg via INTRAVENOUS

## 2017-10-26 MED ORDER — CALAMINE TP LOTN
TOPICAL | 0 refills | Status: DC | PRN
Start: 2017-10-26 — End: 2017-11-02
  Administered 2017-10-26: 177.000 mL via TOPICAL

## 2017-10-26 MED ORDER — MORPHINE 4 MG/ML IV CRTG
2 mg | INTRAVENOUS | 0 refills | Status: DC | PRN
Start: 2017-10-26 — End: 2017-10-29
  Administered 2017-10-26 – 2017-10-29 (×8): 2 mg via INTRAVENOUS

## 2017-10-27 LAB — POC GLUCOSE
Lab: 139 mg/dL — ABNORMAL HIGH (ref 70–100)
Lab: 141 mg/dL — ABNORMAL HIGH (ref 70–100)
Lab: 158 mg/dL — ABNORMAL HIGH (ref 70–100)
Lab: 136 mg/dL — ABNORMAL HIGH (ref 70–100)

## 2017-10-27 LAB — CBC AND DIFF: Lab: 8.4 10*3/uL — ABNORMAL LOW (ref 4.5–11.0)

## 2017-10-27 LAB — PROTIME INR (PT): Lab: 1.1 % — ABNORMAL LOW (ref >60)

## 2017-10-27 LAB — COMPREHENSIVE METABOLIC PANEL: Lab: 139 MMOL/L — ABNORMAL HIGH (ref 137–147)

## 2017-10-27 LAB — BONE ALKALINE PHOSPHATASE: Lab: 24 MMOL/L — ABNORMAL LOW (ref 3.5–5.1)

## 2017-10-27 MED ORDER — OXYCODONE 5 MG PO TAB
5-15 mg | ORAL | 0 refills | Status: DC | PRN
Start: 2017-10-27 — End: 2017-10-29
  Administered 2017-10-27 – 2017-10-28 (×2): 10 mg via ORAL
  Administered 2017-10-28: 08:00:00 15 mg via ORAL
  Administered 2017-10-28: 02:00:00 10 mg via ORAL
  Administered 2017-10-28: 03:00:00 5 mg via ORAL
  Administered 2017-10-28 – 2017-10-29 (×3): 10 mg via ORAL

## 2017-10-27 MED ORDER — PEG-ELECTROLYTE SOLN 420 GRAM PO SOLR
4 L | Freq: Once | ORAL | 0 refills | Status: CP
Start: 2017-10-27 — End: ?
  Administered 2017-10-28: 02:00:00 4 L via ORAL

## 2017-10-28 LAB — POC GLUCOSE
Lab: 203 mg/dL — ABNORMAL HIGH (ref 70–100)
Lab: 143 mg/dL — ABNORMAL HIGH (ref 70–100)
Lab: 161 mg/dL — ABNORMAL HIGH (ref 70–100)
Lab: 193 mg/dL — ABNORMAL HIGH (ref 70–100)
Lab: 242 mg/dL — ABNORMAL HIGH (ref 70–100)

## 2017-10-28 LAB — 5'NUCLEOTIDASE: Lab: 14

## 2017-10-28 LAB — COMPREHENSIVE METABOLIC PANEL: Lab: 137 MMOL/L — ABNORMAL LOW (ref <0.4)

## 2017-10-28 LAB — CBC AND DIFF: Lab: 6.5 10*3/uL — ABNORMAL LOW (ref 4.5–11.0)

## 2017-10-28 MED ORDER — FAMOTIDINE 20 MG PO TAB
20 mg | Freq: Two times a day (BID) | ORAL | 0 refills | Status: DC
Start: 2017-10-28 — End: 2017-10-29
  Administered 2017-10-29: 01:00:00 20 mg via ORAL

## 2017-10-28 MED ORDER — FAMOTIDINE 20 MG PO TAB
20 mg | Freq: Three times a day (TID) | ORAL | 0 refills | Status: DC
Start: 2017-10-28 — End: 2017-10-29
  Administered 2017-10-29 (×2): 20 mg via ORAL

## 2017-10-28 MED ORDER — POTASSIUM CHLORIDE 20 MEQ PO TBTQ
40 meq | Freq: Once | ORAL | 0 refills | Status: CP
Start: 2017-10-28 — End: ?
  Administered 2017-10-28: 14:00:00 40 meq via ORAL

## 2017-10-29 DIAGNOSIS — R1011 Right upper quadrant pain: ICD-10-CM

## 2017-10-29 LAB — POC GLUCOSE
Lab: 141 mg/dL — ABNORMAL HIGH (ref 70–100)
Lab: 158 mg/dL — ABNORMAL HIGH (ref 70–100)
Lab: 171 mg/dL — ABNORMAL HIGH (ref >40)
Lab: 229 mg/dL — ABNORMAL HIGH (ref 70–100)
Lab: 268 mg/dL — ABNORMAL HIGH (ref 70–100)
Lab: 278 mg/dL — ABNORMAL HIGH (ref 70–100)

## 2017-10-29 LAB — COMPREHENSIVE METABOLIC PANEL: Lab: 136 MMOL/L — ABNORMAL LOW (ref >60)

## 2017-10-29 LAB — AMMONIA: Lab: 43 umol/L — ABNORMAL HIGH (ref 9–35)

## 2017-10-29 LAB — CBC AND DIFF: Lab: 8.8 K/UL — ABNORMAL LOW (ref 4.5–11.0)

## 2017-10-29 MED ORDER — POTASSIUM CHLORIDE IN WATER 10 MEQ/50 ML IV PGBK
10 meq | INTRAVENOUS | 0 refills | Status: CP
Start: 2017-10-29 — End: ?
  Administered 2017-10-29 (×4): 10 meq via INTRAVENOUS

## 2017-10-29 MED ORDER — LACTULOSE(#) 200GM/300ML RECTAL ENEMA
200 g | RECTAL | 0 refills | Status: DC
Start: 2017-10-29 — End: 2017-10-30
  Administered 2017-10-30: 09:00:00 200 g via RECTAL

## 2017-10-29 MED ORDER — OXYCODONE 5 MG PO TAB
5 mg | ORAL | 0 refills | Status: DC | PRN
Start: 2017-10-29 — End: 2017-10-31
  Administered 2017-10-30: 14:00:00 5 mg via ORAL

## 2017-10-29 MED ORDER — CETIRIZINE 10 MG PO TAB
10 mg | Freq: Every day | ORAL | 0 refills | Status: DC
Start: 2017-10-29 — End: 2017-11-02
  Administered 2017-10-31 – 2017-11-01 (×2): 10 mg via ORAL

## 2017-10-29 MED ORDER — ONDANSETRON HCL (PF) 4 MG/2 ML IJ SOLN
4 mg | Freq: Once | INTRAVENOUS | 0 refills | Status: CP
Start: 2017-10-29 — End: ?
  Administered 2017-10-29: 15:00:00 4 mg via INTRAVENOUS

## 2017-10-29 MED ORDER — PROCHLORPERAZINE EDISYLATE 5 MG/ML IJ SOLN
5-10 mg | INTRAVENOUS | 0 refills | Status: DC | PRN
Start: 2017-10-29 — End: 2017-11-02
  Administered 2017-10-30 (×3): 5 mg via INTRAVENOUS
  Administered 2017-10-31 – 2017-11-02 (×5): 10 mg via INTRAVENOUS

## 2017-10-29 MED ORDER — FAMOTIDINE 20 MG PO TAB
20 mg | Freq: Two times a day (BID) | ORAL | 0 refills | Status: DC
Start: 2017-10-29 — End: 2017-11-02
  Administered 2017-10-30 – 2017-11-02 (×7): 20 mg via ORAL

## 2017-10-29 MED ORDER — HYDROXYZINE HCL 25 MG PO TAB
25 mg | Freq: Every evening | ORAL | 0 refills | Status: DC | PRN
Start: 2017-10-29 — End: 2017-11-02

## 2017-10-29 MED ORDER — POTASSIUM CHLORIDE 20 MEQ PO TBTQ
60 meq | Freq: Once | ORAL | 0 refills | Status: DC
Start: 2017-10-29 — End: 2017-10-29

## 2017-10-29 MED ORDER — ONDANSETRON HCL (PF) 4 MG/2 ML IJ SOLN
4-8 mg | INTRAVENOUS | 0 refills | Status: DC | PRN
Start: 2017-10-29 — End: 2017-11-02
  Administered 2017-10-29: 21:00:00 8 mg via INTRAVENOUS
  Administered 2017-10-30 (×3): 4 mg via INTRAVENOUS
  Administered 2017-10-31: 15:00:00 8 mg via INTRAVENOUS
  Administered 2017-10-31 – 2017-11-01 (×3): 4 mg via INTRAVENOUS
  Administered 2017-11-01 (×2): 8 mg via INTRAVENOUS
  Administered 2017-11-02 (×2): 4 mg via INTRAVENOUS
  Administered 2017-11-02: 01:00:00 8 mg via INTRAVENOUS

## 2017-10-29 MED ADMIN — SODIUM CHLORIDE 0.9 % IV SOLP [27838]: 1000 mL | INTRAVENOUS | @ 17:00:00 | Stop: 2017-10-29 | NDC 00338004904

## 2017-10-30 ENCOUNTER — Encounter: Admit: 2017-10-30 | Discharge: 2017-10-30 | Payer: MEDICARE

## 2017-10-30 DIAGNOSIS — R1011 Right upper quadrant pain: ICD-10-CM

## 2017-10-30 LAB — POC GLUCOSE
Lab: 249 mg/dL — ABNORMAL HIGH (ref 70–100)
Lab: 246 mg/dL — ABNORMAL HIGH (ref 70–100)
Lab: 169 mg/dL — ABNORMAL HIGH (ref 70–100)
Lab: 161 mg/dL — ABNORMAL HIGH (ref 70–100)

## 2017-10-30 LAB — CBC AND DIFF: Lab: 8.8 K/UL — ABNORMAL LOW (ref 4.5–11.0)

## 2017-10-30 LAB — COMPREHENSIVE METABOLIC PANEL: Lab: 138 MMOL/L — ABNORMAL LOW (ref >60)

## 2017-10-30 MED ORDER — HYDROCORTISONE 2.5 % TP CREA
Freq: Two times a day (BID) | TOPICAL | 0 refills | Status: DC
Start: 2017-10-30 — End: 2017-11-02
  Administered 2017-10-30: 20:00:00 via TOPICAL

## 2017-10-30 MED ORDER — POLYETHYLENE GLYCOL 3350 17 GRAM PO PWPK
1 | Freq: Two times a day (BID) | ORAL | 0 refills | Status: DC
Start: 2017-10-30 — End: 2017-11-01
  Administered 2017-10-31 – 2017-11-01 (×2): 17 g via ORAL

## 2017-10-30 MED ORDER — PROMETHAZINE 25 MG/ML IJ SOLN
12.5 mg | Freq: Once | INTRAVENOUS | 0 refills | Status: CP
Start: 2017-10-30 — End: ?
  Administered 2017-10-30: 06:00:00 12.5 mg via INTRAVENOUS

## 2017-10-30 MED ORDER — TRIAMCINOLONE ACETONIDE 0.1 % TP OINT
Freq: Two times a day (BID) | TOPICAL | 0 refills | Status: DC
Start: 2017-10-30 — End: 2017-11-02
  Administered 2017-10-30: 20:00:00 via TOPICAL

## 2017-10-30 MED ORDER — FONDAPARINUX 2.5 MG/0.5 ML SC SYRG
2.5 mg | Freq: Every day | SUBCUTANEOUS | 0 refills | Status: DC
Start: 2017-10-30 — End: 2017-11-02
  Administered 2017-10-30 – 2017-11-01 (×3): 2.5 mg via SUBCUTANEOUS

## 2017-10-30 MED ORDER — INSULIN GLARGINE 100 UNIT/ML (3 ML) SC INJ PEN
43 [IU] | Freq: Every evening | SUBCUTANEOUS | 0 refills | Status: DC
Start: 2017-10-30 — End: 2017-11-02

## 2017-10-31 ENCOUNTER — Encounter: Admit: 2017-10-31 | Discharge: 2017-10-31 | Payer: MEDICARE

## 2017-10-31 LAB — CBC AND DIFF: Lab: 6.8 10*3/uL — ABNORMAL LOW (ref >60)

## 2017-10-31 LAB — POC GLUCOSE
Lab: 191 mg/dL — ABNORMAL HIGH (ref 70–100)
Lab: 185 mg/dL — ABNORMAL HIGH (ref 70–100)
Lab: 189 mg/dL — ABNORMAL HIGH (ref >=60)
Lab: 208 mg/dL — ABNORMAL HIGH (ref 70–100)

## 2017-10-31 LAB — COMPREHENSIVE METABOLIC PANEL: Lab: 137 MMOL/L — ABNORMAL LOW (ref 137–147)

## 2017-10-31 MED ORDER — POLYETHYLENE GLYCOL 3350 17 GRAM PO PWPK
1 | ORAL | 0 refills | Status: DC
Start: 2017-10-31 — End: 2017-11-02
  Administered 2017-11-01: 13:00:00 17 g via ORAL

## 2017-10-31 MED ORDER — METOCLOPRAMIDE HCL 10 MG PO TAB
10 mg | Freq: Before meals | ORAL | 0 refills | Status: DC
Start: 2017-10-31 — End: 2017-11-02
  Administered 2017-11-01 – 2017-11-02 (×7): 10 mg via ORAL

## 2017-11-01 LAB — COMPREHENSIVE METABOLIC PANEL: Lab: 139 MMOL/L — ABNORMAL LOW (ref 137–147)

## 2017-11-01 LAB — POC GLUCOSE
Lab: 211 mg/dL — ABNORMAL HIGH (ref 70–100)
Lab: 150 mg/dL — ABNORMAL HIGH (ref 70–100)
Lab: 141 mg/dL — ABNORMAL HIGH (ref 70–100)
Lab: 196 mg/dL — ABNORMAL HIGH (ref 70–100)

## 2017-11-01 LAB — CBC AND DIFF: Lab: 7.2 K/UL — ABNORMAL LOW (ref >60)

## 2017-11-01 LAB — MAGNESIUM: Lab: 2 mg/dL (ref 1.6–2.6)

## 2017-11-01 MED ORDER — POTASSIUM CHLORIDE 20 MEQ PO TBTQ
40 meq | Freq: Once | ORAL | 0 refills | Status: CP
Start: 2017-11-01 — End: ?
  Administered 2017-11-01: 17:00:00 40 meq via ORAL

## 2017-11-02 ENCOUNTER — Inpatient Hospital Stay: Admit: 2017-10-29 | Discharge: 2017-10-29 | Payer: MEDICARE

## 2017-11-02 ENCOUNTER — Inpatient Hospital Stay: Admit: 2017-11-01 | Discharge: 2017-11-01 | Payer: MEDICARE

## 2017-11-02 ENCOUNTER — Inpatient Hospital Stay: Admit: 2017-10-23 | Discharge: 2017-11-02 | Disposition: A | Discharge status: Home / Self Care | Payer: MEDICARE

## 2017-11-02 ENCOUNTER — Inpatient Hospital Stay: Admit: 2017-10-31 | Discharge: 2017-10-31 | Payer: MEDICARE

## 2017-11-02 ENCOUNTER — Inpatient Hospital Stay: Admit: 2017-10-30 | Discharge: 2017-10-30 | Payer: MEDICARE

## 2017-11-02 ENCOUNTER — Emergency Department: Admit: 2017-10-24 | Discharge: 2017-10-24 | Payer: MEDICARE

## 2017-11-02 DIAGNOSIS — R161 Splenomegaly, not elsewhere classified: ICD-10-CM

## 2017-11-02 DIAGNOSIS — T361X5A Adverse effect of cephalosporins and other beta-lactam antibiotics, initial encounter: ICD-10-CM

## 2017-11-02 DIAGNOSIS — J45909 Unspecified asthma, uncomplicated: ICD-10-CM

## 2017-11-02 DIAGNOSIS — K6389 Other specified diseases of intestine: Principal | ICD-10-CM

## 2017-11-02 DIAGNOSIS — E1142 Type 2 diabetes mellitus with diabetic polyneuropathy: ICD-10-CM

## 2017-11-02 DIAGNOSIS — G8929 Other chronic pain: ICD-10-CM

## 2017-11-02 DIAGNOSIS — S32591G Other specified fracture of right pubis, subsequent encounter for fracture with delayed healing: ICD-10-CM

## 2017-11-02 DIAGNOSIS — K7581 Nonalcoholic steatohepatitis (NASH): ICD-10-CM

## 2017-11-02 DIAGNOSIS — Z6841 Body Mass Index (BMI) 40.0 and over, adult: ICD-10-CM

## 2017-11-02 DIAGNOSIS — Z794 Long term (current) use of insulin: ICD-10-CM

## 2017-11-02 DIAGNOSIS — K7469 Other cirrhosis of liver: ICD-10-CM

## 2017-11-02 DIAGNOSIS — E1122 Type 2 diabetes mellitus with diabetic chronic kidney disease: ICD-10-CM

## 2017-11-02 DIAGNOSIS — K861 Other chronic pancreatitis: ICD-10-CM

## 2017-11-02 DIAGNOSIS — Z87891 Personal history of nicotine dependence: ICD-10-CM

## 2017-11-02 DIAGNOSIS — N179 Acute kidney failure, unspecified: ICD-10-CM

## 2017-11-02 DIAGNOSIS — Z9049 Acquired absence of other specified parts of digestive tract: ICD-10-CM

## 2017-11-02 DIAGNOSIS — B009 Herpesviral infection, unspecified: ICD-10-CM

## 2017-11-02 DIAGNOSIS — L270 Generalized skin eruption due to drugs and medicaments taken internally: ICD-10-CM

## 2017-11-02 DIAGNOSIS — K766 Portal hypertension: ICD-10-CM

## 2017-11-02 DIAGNOSIS — F329 Major depressive disorder, single episode, unspecified: ICD-10-CM

## 2017-11-02 DIAGNOSIS — K7290 Hepatic failure, unspecified without coma: ICD-10-CM

## 2017-11-02 DIAGNOSIS — N183 Chronic kidney disease, stage 3 (moderate): ICD-10-CM

## 2017-11-02 DIAGNOSIS — R262 Difficulty in walking, not elsewhere classified: ICD-10-CM

## 2017-11-02 DIAGNOSIS — R188 Other ascites: ICD-10-CM

## 2017-11-02 DIAGNOSIS — E039 Hypothyroidism, unspecified: ICD-10-CM

## 2017-11-02 DIAGNOSIS — Z9884 Bariatric surgery status: ICD-10-CM

## 2017-11-02 LAB — POC GLUCOSE
Lab: 138 mg/dL — ABNORMAL HIGH (ref 70–100)
Lab: 111 mg/dL — ABNORMAL HIGH (ref 70–100)
Lab: 84 mg/dL (ref 70–100)

## 2017-11-02 LAB — CBC AND DIFF: Lab: 7.2 10*3/uL — ABNORMAL LOW (ref 4.5–11.0)

## 2017-11-02 LAB — COMPREHENSIVE METABOLIC PANEL: Lab: 141 MMOL/L — ABNORMAL LOW (ref 137–147)

## 2017-11-02 MED ORDER — POLYETHYLENE GLYCOL 3350 17 GRAM PO PWPK
17 g | Freq: Every day | ORAL | 3 refills | 18.00000 days | Status: AC | PRN
Start: 2017-11-02 — End: 2018-04-28

## 2017-11-02 MED ORDER — ONDANSETRON 4 MG PO TBDI
4 mg | ORAL_TABLET | ORAL | 0 refills | 8.00000 days | Status: AC | PRN
Start: 2017-11-02 — End: ?

## 2017-11-02 MED ORDER — METOCLOPRAMIDE HCL 10 MG PO TAB
10 mg | ORAL_TABLET | Freq: Before meals | ORAL | 1 refills | Status: AC
Start: 2017-11-02 — End: ?

## 2017-11-04 ENCOUNTER — Encounter: Admit: 2017-11-04 | Discharge: 2017-11-04 | Payer: MEDICARE

## 2017-11-06 ENCOUNTER — Encounter: Admit: 2017-11-06 | Discharge: 2017-11-06 | Payer: MEDICARE

## 2017-11-06 DIAGNOSIS — J45909 Unspecified asthma, uncomplicated: Principal | ICD-10-CM

## 2017-11-06 DIAGNOSIS — K7460 Unspecified cirrhosis of liver: ICD-10-CM

## 2017-11-06 DIAGNOSIS — F99 Mental disorder, not otherwise specified: ICD-10-CM

## 2017-11-06 DIAGNOSIS — E079 Disorder of thyroid, unspecified: ICD-10-CM

## 2017-11-17 ENCOUNTER — Encounter: Admit: 2017-11-17 | Discharge: 2017-11-17 | Payer: MEDICARE

## 2017-11-28 ENCOUNTER — Encounter: Admit: 2017-11-28 | Discharge: 2017-11-28 | Payer: MEDICARE

## 2017-12-02 ENCOUNTER — Encounter: Admit: 2017-12-02 | Discharge: 2017-12-02 | Payer: MEDICARE

## 2017-12-18 ENCOUNTER — Encounter: Admit: 2017-12-18 | Discharge: 2017-12-18 | Payer: MEDICARE

## 2017-12-25 ENCOUNTER — Ambulatory Visit: Admit: 2017-12-25 | Discharge: 2017-12-26 | Payer: MEDICARE

## 2017-12-25 ENCOUNTER — Encounter: Admit: 2017-12-25 | Discharge: 2017-12-25 | Payer: MEDICARE

## 2017-12-25 DIAGNOSIS — E079 Disorder of thyroid, unspecified: ICD-10-CM

## 2017-12-25 DIAGNOSIS — K7460 Unspecified cirrhosis of liver: ICD-10-CM

## 2017-12-25 DIAGNOSIS — J45909 Unspecified asthma, uncomplicated: Principal | ICD-10-CM

## 2017-12-25 DIAGNOSIS — F99 Mental disorder, not otherwise specified: ICD-10-CM

## 2017-12-26 DIAGNOSIS — K7460 Unspecified cirrhosis of liver: Principal | ICD-10-CM

## 2018-01-31 ENCOUNTER — Encounter: Admit: 2018-01-31 | Discharge: 2018-01-31 | Payer: MEDICARE

## 2018-02-02 ENCOUNTER — Encounter: Admit: 2018-02-02 | Discharge: 2018-02-02 | Payer: MEDICARE

## 2018-02-18 ENCOUNTER — Encounter: Admit: 2018-02-18 | Discharge: 2018-02-18 | Payer: MEDICARE

## 2018-02-18 DIAGNOSIS — K7469 Other cirrhosis of liver: Principal | ICD-10-CM

## 2018-02-24 ENCOUNTER — Ambulatory Visit: Admit: 2018-02-24 | Discharge: 2018-02-24 | Payer: MEDICARE

## 2018-02-24 ENCOUNTER — Encounter: Admit: 2018-02-24 | Discharge: 2018-02-24 | Payer: MEDICARE

## 2018-02-24 DIAGNOSIS — K7469 Other cirrhosis of liver: Principal | ICD-10-CM

## 2018-03-12 ENCOUNTER — Encounter: Admit: 2018-03-12 | Discharge: 2018-03-12 | Payer: MEDICARE

## 2018-03-27 ENCOUNTER — Encounter: Admit: 2018-03-27 | Discharge: 2018-03-27 | Payer: MEDICARE

## 2018-04-20 ENCOUNTER — Encounter: Admit: 2018-04-20 | Discharge: 2018-04-20 | Payer: MEDICARE

## 2018-04-28 ENCOUNTER — Encounter: Admit: 2018-04-28 | Discharge: 2018-04-28 | Payer: MEDICARE

## 2018-04-28 ENCOUNTER — Ambulatory Visit: Admit: 2018-04-28 | Discharge: 2018-04-29 | Payer: MEDICARE

## 2018-04-28 DIAGNOSIS — K7460 Unspecified cirrhosis of liver: ICD-10-CM

## 2018-04-28 DIAGNOSIS — E079 Disorder of thyroid, unspecified: ICD-10-CM

## 2018-04-28 DIAGNOSIS — F99 Mental disorder, not otherwise specified: ICD-10-CM

## 2018-04-28 DIAGNOSIS — J45909 Unspecified asthma, uncomplicated: Principal | ICD-10-CM

## 2018-04-28 MED ORDER — PEG 3350-ELECTROLYTES 236-22.74-6.74 -5.86 GRAM PO SOLR
0 refills | 1.00000 days | Status: AC
Start: 2018-04-28 — End: 2018-12-08

## 2018-06-01 ENCOUNTER — Encounter: Admit: 2018-06-01 | Discharge: 2018-06-02 | Payer: MEDICARE

## 2018-10-07 ENCOUNTER — Encounter: Admit: 2018-10-07 | Discharge: 2018-10-07 | Payer: MEDICARE

## 2018-10-07 ENCOUNTER — Ambulatory Visit: Admit: 2018-10-07 | Discharge: 2018-10-08 | Payer: MEDICARE

## 2018-10-07 DIAGNOSIS — F99 Mental disorder, not otherwise specified: ICD-10-CM

## 2018-10-07 DIAGNOSIS — K7460 Unspecified cirrhosis of liver: ICD-10-CM

## 2018-10-07 DIAGNOSIS — E079 Disorder of thyroid, unspecified: ICD-10-CM

## 2018-10-07 DIAGNOSIS — J45909 Unspecified asthma, uncomplicated: Principal | ICD-10-CM

## 2018-10-07 MED ORDER — PROCHLORPERAZINE MALEATE 5 MG PO TAB
5 mg | ORAL_TABLET | ORAL | 1 refills | Status: AC | PRN
Start: 2018-10-07 — End: 2019-01-26

## 2018-10-07 MED ORDER — AMITRIPTYLINE 75 MG PO TAB
ORAL_TABLET | 5 refills | Status: AC
Start: 2018-10-07 — End: 2019-03-30

## 2018-10-07 NOTE — Patient Instructions
Alexis Mcconnell 54 y.o. 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     1. Migraine with aura and without status migrainosus, not intractable    2. Dizziness and giddiness      RECOMMENDATIONS  - Advised adequate hydration to avoid dehydration related headache.   - Requested MRI brain to be uploaded to PACS for review.   - Start migrelief that contains magnesium, riboflavin and feverfew.  - Start using headache dairy to help identify other triggers.   - Increase the amitriptyline to 75mg  daily for migraine prevention.     - For severe migraine take compazine (total of 12 tablets a month) and benadryl. May add additional migrelief.     Orders Placed This Encounter   ??? amitriptyline (ELAVIL) 75 mg tablet   ??? prochlorperazine (COMPAZINE) 5 mg tablet     FOLLOWUP PLAN  Return in about 4 months (around 02/06/2019).  The patient is instructed to contact me if there are any concerns with the agreed plan.

## 2018-10-08 DIAGNOSIS — R42 Dizziness and giddiness: ICD-10-CM

## 2018-10-08 DIAGNOSIS — G43109 Migraine with aura, not intractable, without status migrainosus: Principal | ICD-10-CM

## 2018-10-12 ENCOUNTER — Encounter: Admit: 2018-10-12 | Discharge: 2018-10-12 | Payer: MEDICARE

## 2018-10-19 ENCOUNTER — Encounter: Admit: 2018-10-19 | Discharge: 2018-10-19 | Payer: MEDICARE

## 2018-10-19 DIAGNOSIS — K7469 Other cirrhosis of liver: Principal | ICD-10-CM

## 2018-10-23 ENCOUNTER — Encounter: Admit: 2018-10-23 | Discharge: 2018-10-23 | Payer: MEDICARE

## 2018-11-10 ENCOUNTER — Encounter: Admit: 2018-11-10 | Discharge: 2018-11-10 | Payer: MEDICARE

## 2018-11-10 NOTE — Progress Notes
Re-requested most recent abdominal imaging from [facility] via fax @ [phone].

## 2018-11-11 ENCOUNTER — Encounter: Admit: 2018-11-11 | Discharge: 2018-11-11 | Payer: MEDICARE

## 2018-11-11 NOTE — Progress Notes
Re-requested CT & [place] from [facility] @ [phone]

## 2018-11-12 ENCOUNTER — Encounter: Admit: 2018-11-12 | Discharge: 2018-11-12 | Payer: MEDICARE

## 2018-11-12 ENCOUNTER — Ambulatory Visit: Admit: 2018-11-12 | Discharge: 2018-11-13 | Payer: MEDICARE

## 2018-11-12 DIAGNOSIS — 1 ERRONEOUS ENCOUNTER--DISREGARD: Principal | ICD-10-CM

## 2018-11-16 ENCOUNTER — Encounter: Admit: 2018-11-16 | Discharge: 2018-11-16 | Payer: MEDICARE

## 2018-11-16 NOTE — Progress Notes
This encounter was created in error. Please disregard. 

## 2018-12-01 ENCOUNTER — Encounter: Admit: 2018-12-01 | Discharge: 2018-12-01 | Payer: MEDICARE

## 2018-12-02 ENCOUNTER — Encounter: Admit: 2018-12-02 | Discharge: 2018-12-02 | Payer: MEDICARE

## 2018-12-03 ENCOUNTER — Encounter: Admit: 2018-12-03 | Discharge: 2018-12-03 | Payer: MEDICARE

## 2018-12-04 ENCOUNTER — Ambulatory Visit: Admit: 2018-12-04 | Discharge: 2018-12-05 | Payer: MEDICARE

## 2018-12-04 ENCOUNTER — Encounter: Admit: 2018-12-04 | Discharge: 2018-12-04 | Payer: MEDICARE

## 2018-12-04 DIAGNOSIS — J45909 Unspecified asthma, uncomplicated: Principal | ICD-10-CM

## 2018-12-04 DIAGNOSIS — K7460 Unspecified cirrhosis of liver: ICD-10-CM

## 2018-12-04 DIAGNOSIS — F99 Mental disorder, not otherwise specified: ICD-10-CM

## 2018-12-04 DIAGNOSIS — E079 Disorder of thyroid, unspecified: ICD-10-CM

## 2018-12-05 DIAGNOSIS — K7469 Other cirrhosis of liver: Principal | ICD-10-CM

## 2018-12-07 ENCOUNTER — Encounter: Admit: 2018-12-07 | Discharge: 2018-12-07 | Payer: MEDICARE

## 2018-12-07 NOTE — Progress Notes
attributes her weight gain since the gastric sleeve to a number of things.  She has been less active, and possibly eating more, as she has been very worried about the COVID-19 pandemic.    Ms. Gilgenbach lives with her husband.    Alexis Mcconnell endorses no other worrisome health concerns at today's visit.       Review of Systems: A 10 point review systems is performed and with the exceptions noted in the HPI are otherwise unremarkable.     Objective:         ??? acetaminophen (TYLENOL) 325 mg tablet Take 650 mg by mouth every 4 hours as needed for Pain.   ??? amitriptyline (ELAVIL) 75 mg tablet Take 75mg  at night   ??? baclofen (LIORESAL) 10 mg tablet Take 10 mg by mouth.   ??? Butalbital-Acetaminophen-Caff (FIORICET) 50-300-40 mg cap    ??? Calcium Citrate-Vitamin D3 (CALCIUM CITRATE + D) 315-200 mg-unit tab Take 1 tablet by mouth three times daily. Give separately from multivitamin dose   ??? citalopram hydrobromide (CITALOPRAM PO) Take  by mouth.   ??? clonazePAM (KLONOPIN) 0.5 mg tablet Take 0.5 mg by mouth daily as needed. Indications: itching   ??? gabapentin (NEURONTIN) 800 mg tablet Take 800 mg by mouth three times daily.   ??? insulin aspart U-100 (NOVOLOG FLEXPEN U-100 INSULIN) 100 unit/mL injection PEN Inject 8 Units under the skin three times daily with meals.   ??? insulin detemir (LEVEMIR FLEXPEN SC) Inject 40 Units under the skin at bedtime daily.   ??? levothyroxine (SYNTHROID) 175 mcg tablet Take 175 mcg by mouth daily 30 minutes before breakfast.   ??? MULTIVITAMIN PO Take 3 capsules by mouth daily with breakfast. give separately from calcium citrate doses.   ??? ondansetron (ZOFRAN ODT) 4 mg rapid dissolve tablet Dissolve one tablet by mouth every 8 hours as needed for Nausea or Vomiting. Place on tongue to disolve.   ??? PEG 3350-Electrolytes (GOLYTELY, GAVILYTE-G) oral solution Mix as directed on package. Drink (8oz) every 10 minutes until gone. Refrigerate once mixed. ??? prochlorperazine (COMPAZINE) 5 mg tablet Take one tablet by mouth every 6 hours as needed for Nausea.   ??? promethazine (PHENERGAN) 25 mg tablet    ??? sodium chloride (SALINE MIST) 0.65 % nasal spray Apply 1-2 sprays to each nostril as directed as Needed.   ??? valACYclovir (VALTREX) 500 mg tablet Take 500 mg by mouth every 24 hours.     Vitals:    12/04/18 1424   BP: (!) 177/85   BP Source: Arm, Left Upper   Patient Position: Sitting   Pulse: 68   Temp: 36.4 ???C (97.6 ???F)   TempSrc: Oral   Weight: 117.6 kg (259 lb 3.2 oz)   Height: 152.4 cm (60)   PainSc: Six     Body mass index is 50.62 kg/m???.    Limited Observational Exam   Constitutional: She appears well-nourished. No distress. Very pleasant.   Eyes: No scleral icterus.   Pulmonary/Chest: Effort normal  Abdominal: She exhibits no distension  Musculoskeletal: Ambulates independently. No significant LE edema.   Neurological: She is alert and oriented to person, place, and time.   Skin: No obvious jaundice or spider angiomas        Assessment and Plan:  1.  Cirrhosis secondary to NASH:  Labs available to me from recent hospitalization demonstrate stability from a liver function perspective.  An INR was not obtained, but remaining components of the MELD score are within normal  limits.  I strongly recommend adequate diabetic control and weight loss as a benefit to patient's overall health.  This may also help slow the progression of her chronic liver disease.      We will continue with a conservative plan of care including routine clinic follow-up, symptom management, and ensuring that she stays up-to-date on recommended screening tests.    2.  Screening for hepatocellular carcinoma: Ms. Inoue did have imaging performed with her most recent hospitalization, however it was a non-contrasted study, and so she is currently due for ultrasound to screen for [facility].  We will arrange for this closer to home.  ???

## 2018-12-07 NOTE — Progress Notes
Faxed [place] Abd complete order to [facility] [phone] through [employer]. Facility to call pt to schedule.

## 2018-12-10 ENCOUNTER — Encounter: Admit: 2018-12-10 | Discharge: 2018-12-10 | Payer: MEDICARE

## 2018-12-11 ENCOUNTER — Encounter: Admit: 2018-12-11 | Discharge: 2018-12-11 | Payer: MEDICARE

## 2018-12-15 MED ORDER — METHYLPREDNISOLONE 4 MG PO DSPK
ORAL_TABLET | 0 refills | Status: DC
Start: 2018-12-15 — End: 2019-02-01

## 2019-01-11 ENCOUNTER — Encounter: Admit: 2019-01-11 | Discharge: 2019-01-12

## 2019-01-12 ENCOUNTER — Encounter: Admit: 2019-01-12 | Discharge: 2019-01-12

## 2019-01-12 DIAGNOSIS — K7469 Other cirrhosis of liver: Secondary | ICD-10-CM

## 2019-01-12 NOTE — Progress Notes
Requested [place] report and images to be clouded from [facility].

## 2019-01-13 ENCOUNTER — Encounter: Admit: 2019-01-13 | Discharge: 2019-01-13

## 2019-01-26 ENCOUNTER — Encounter: Admit: 2019-01-26 | Discharge: 2019-01-26

## 2019-01-26 MED ORDER — PROCHLORPERAZINE MALEATE 5 MG PO TAB
ORAL_TABLET | Freq: Four times a day (QID) | 1 refills | Status: DC | PRN
Start: 2019-01-26 — End: 2019-11-11

## 2019-01-28 ENCOUNTER — Encounter: Admit: 2019-01-28 | Discharge: 2019-01-28

## 2019-01-29 NOTE — Telephone Encounter
Dr. Gray Bernhardt please see pt's update. Worsening dizziness and headache. Please advise on next steps. Thanks

## 2019-02-01 MED ORDER — METHYLPREDNISOLONE 4 MG PO DSPK
ORAL_TABLET | 0 refills | Status: DC
Start: 2019-02-01 — End: 2019-02-11

## 2019-02-01 NOTE — Telephone Encounter
Lets try medrol dose pack for these severe headaches. Thanks.

## 2019-02-11 ENCOUNTER — Encounter: Admit: 2019-02-11 | Discharge: 2019-02-11

## 2019-02-11 DIAGNOSIS — J45909 Unspecified asthma, uncomplicated: Secondary | ICD-10-CM

## 2019-02-11 DIAGNOSIS — K7460 Unspecified cirrhosis of liver: Secondary | ICD-10-CM

## 2019-02-11 DIAGNOSIS — F99 Mental disorder, not otherwise specified: Secondary | ICD-10-CM

## 2019-02-11 DIAGNOSIS — E079 Disorder of thyroid, unspecified: Secondary | ICD-10-CM

## 2019-02-11 MED ORDER — UBROGEPANT 50 MG PO TAB
50 mg | ORAL_TABLET | Freq: Every day | ORAL | 3 refills | 30.00000 days | Status: DC | PRN
Start: 2019-02-11 — End: 2019-02-15

## 2019-02-11 NOTE — Patient Instructions
Alexis Mcconnell No 53 y.o. 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     1. Vestibular migraine       Tried medication  - Amitriptyline provided no benefit.   - Patient is not a candidate for triptans due to risk of coronary artery disease in the setting diabetes.     RECOMMENDATIONS  - Advised adequate hydration to avoid dehydration related headache.   - Requested MRI brain to be uploaded to PACS for review.   - Continue migrelief that contains magnesium, riboflavin and feverfew.  - Start using headache dairy to help identify other triggers.   - Decrease the amitriptyline to 37.5 (half) for three weeks then reduced to 18.75mg  (quarter) for 6 weeks then stop.   - Start effexor 37.5mg  daily for vestibular migraine treatment. If tolerable will consider switch from citalopram. Patient will notify her psychiatry. Consider topiramate if these therapy doesn't work.   - Patient had been getting valium from outside providers, communicated to patient that this is not the first line of therapy for this condition and rarely prescribe it.   - For severe migraine ordered Uberelvy 50mg . Dose may be repeated in 2 hours. Max 2 tablets a day. Total of 10 tablets a month. Compazine (total of 12 tablets a month) and benadryl. May add additional migrelief.

## 2019-02-11 NOTE — Progress Notes
Date of Service: 02/11/2019    Subjective:             Alexis Mcconnell is a 53 y.o. female.    Referral Physician: Rowland Lathe, DO     History of Present Illness  Alexis Mcconnell 53 y.o. 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     Patient was last seen in clinic on 10/2018 for migraine with aura and dizziness. She was continued on amitriptyline with increased dose. She continues with dizziness and migraine and has been getting them lasting for two days. The headache is located posteriorly and feels like throbbing quality. She has photophobia and nausea. She denies phonophobia. She feels like the amitriptyline is not working.     Review of Records:     Medical History:   Diagnosis Date   ??? Asthma    ??? Disorder of thyroid gland    ??? Liver cirrhosis (HCC)     diagnosed in [place]   ??? Psychiatric illness        Surgical History:   Procedure Laterality Date   ??? ESOPHAGEAL DILATATION  2015    x2   ??? ESOPHAGOGASTRODUODENOSCOPY N/A 01/10/2017    Performed by Dawna Part, MD at [facility] ENDO   ??? COLONOSCOPY N/A 01/10/2017    Performed by Dawna Part, MD at [facility] ENDO   ??? ESOPHAGOGASTRODUODENOSCOPY EXCISION LESION  01/10/2017    Performed by Dawna Part, MD at [facility] ENDO   ??? COLONOSCOPY EXCISION LESION  01/10/2017    Performed by Dawna Part, MD at [facility] ENDO   ??? ESOPHAGOGASTRODUODENOSCOPY N/A 04/10/2017    Performed by Maryan Char, MD at [facility] ENDO   ??? ESOPHAGOGASTRODUODENOSCOPY BIOPSY  04/10/2017    Performed by Maryan Char, MD at [facility] ENDO   ??? ABDOMEN SURGERY     ??? BARIATRIC SURGERY     ??? HX CHOLECYSTECTOMY     ??? HX HYSTERECTOMY           Social History     Socioeconomic History   ??? Marital status: Married     Spouse name: Not on file   ??? Number of children: Not on file   ??? Years of education: Not on file   ??? Highest education level: Not on file Occupational History   ??? Not on file   Tobacco Use   ??? Smoking status: Former Smoker     Last attempt to quit: 03/18/1998     Years since quitting: 20.9   ??? Smokeless tobacco: Never Used   Substance and Sexual Activity   ??? Alcohol use: No   ??? Drug use: No   ??? Sexual activity: Not Currently   Other Topics Concern   ??? Not on file   Social History Narrative   ??? Not on file         Family History   Problem Relation Age of Onset   ??? COPD Mother    ??? Coronary Artery Disease Father        Allergies   Allergen Reactions   ??? Citrus Fruit [Citrus And Derivatives] HEADACHE   ??? 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 Iv Contrast Dye, Iodine Containing [Iodinated Contrast Media] RASH   ??? Levaquin [Levofloxacin] RASH   ??? Rocephin [Ceftriaxone] RASH   ???  Sulfa (Sulfonamide Antibiotics) RASH   ??? Citric Acid HEADACHE   ??? Lovenox [Enoxaparin] AGITATION   ??? Propofol SEE COMMENTS     Per [facility] 2010 records, postoperative sepsis and acute renal failure necessitating dialysis likely caused by lasix and hypoperfusion, rhabdomyolysis.  Differential diagnosis included propofol infusion syndrome.         Review of Systems   Constitutional: Negative.    HENT: Negative.    Eyes: Negative.    Respiratory: Negative.    Cardiovascular: Negative.    Gastrointestinal: Negative.    Endocrine: Negative.    Genitourinary: Negative.    Musculoskeletal: Negative.    Skin: Negative.    Allergic/Immunologic: Positive for food allergies.   Neurological: Positive for dizziness and headaches.   Hematological: Negative.    Psychiatric/Behavioral: The patient is nervous/anxious.          Objective:         ??? acetaminophen (TYLENOL) 325 mg tablet Take 650 mg by mouth every 4 hours as needed for Pain.   ??? amitriptyline (ELAVIL) 75 mg tablet Take 75mg  at night   ??? baclofen (LIORESAL) 10 mg tablet Take 10 mg by mouth.   ??? Butalbital-Acetaminophen-Caff (FIORICET) 50-300-40 mg cap ??? Calcium Citrate-Vitamin D3 (CALCIUM CITRATE + D) 315-200 mg-unit tab Take 1 tablet by mouth three times daily. Give separately from multivitamin dose   ??? citalopram hydrobromide (CITALOPRAM PO) Take  by mouth.   ??? clonazePAM (KLONOPIN) 0.5 mg tablet Take 0.5 mg by mouth daily as needed. Indications: itching   ??? gabapentin (NEURONTIN) 800 mg tablet Take 800 mg by mouth three times daily.   ??? insulin aspart U-100 (NOVOLOG FLEXPEN U-100 INSULIN) 100 unit/mL injection PEN Inject 8 Units under the skin three times daily with meals.   ??? insulin detemir (LEVEMIR FLEXPEN SC) Inject 40 Units under the skin at bedtime daily.   ??? levothyroxine (SYNTHROID) 175 mcg tablet Take 175 mcg by mouth daily 30 minutes before breakfast.   ??? MULTIVITAMIN PO Take 3 capsules by mouth daily with breakfast. give separately from calcium citrate doses.   ??? ondansetron (ZOFRAN ODT) 4 mg rapid dissolve tablet Dissolve one tablet by mouth every 8 hours as needed for Nausea or Vomiting. Place on tongue to disolve.   ??? prochlorperazine (COMPAZINE) 5 mg tablet TAKE 1 TABLET BY MOUTH EVERY 6 HOURS AS NEEDED NAUSEA. FOR SEVERE MIGRAINE, USE NO MORE THAN 3 TABLETS PER DAY OR 12 TABLETS PER MONTH   ??? promethazine (PHENERGAN) 25 mg tablet    ??? sodium chloride (SALINE MIST) 0.65 % nasal spray Apply 1-2 sprays to each nostril as directed as Needed.   ??? ubrogepant (UBRELVY) 50 mg tablet Take one tablet by mouth daily as needed. May repeat once after 2 hours based on response.   ??? valACYclovir (VALTREX) 500 mg tablet Take 500 mg by mouth every 24 hours.     Vitals:    02/11/19 1453   BP: 138/62   BP Source: Arm, Left Upper   Patient Position: Sitting   Pulse: 82   Weight: 121.7 kg (268 lb 6.4 oz)   Height: 152.4 cm (60)     Body mass index is 52.42 kg/m???.     Physical Exam  General: no acute distress, cooperative    HEENT: normocephalic, atraumatic, fundi benign   CV: well perfused  Pulm: equal chest rise, non labored breathing Ext: no swelling, cyanosis and clubbing     Neurological Exam   Mental status: Alert and  oriented to time, place, person and situation.  Speech: Fluent, with normal naming, comprehension, articulation and repetition  CN II-XII: Visual fields intact to confrontation, PERRL (4->2), EOMI, facial sensation intact. Symmetrical facial movement. Hearing grossly intact. Strong cough, elevates palate, uvula midline. Strong shoulder shrug. Tongue midline.  Motor: (R/L) b/l UE/LE 5/5 in proximal and distal muscle groups. Normal tone and bulk. No abnormal movement, fasciculation or pronator drift.  Sensory: Decreased light touch on the left lower extremity otherwise normal.   Reflexes: (R/L) 2/2Bj, 2/2Trj, 1/1Brj, 1/1Knj, 0/0Aj.   Coordination/ fine movement: Normal finger to nose, heel to shin.  Gait: Normal, patient able to walk on heels, toes and in tandem. Romberg sign absent.       Assessment and Plan:  Alexis Mcconnell 53 y.o. 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     1. Vestibular migraine       Tried medication  - Amitriptyline provided no benefit.   - Patient is not a candidate for triptans due to risk of coronary artery disease in the setting diabetes.     RECOMMENDATIONS  - Advised adequate hydration to avoid dehydration related headache.   - Requested MRI brain to be uploaded to PACS for review.   - Continue migrelief that contains magnesium, riboflavin and feverfew.  - Decrease the amitriptyline to 37.5 (half) for 3 weeks then reduced to 18.75mg  (quarter) for 6 weeks then stop.   - Start effexor 37.5mg  daily for vestibular migraine treatment. If tolerable will consider switch from citalopram. Patient will notify her psychiatry. Consider topiramate if these therapy doesn't work. - Patient had been getting valium from outside providers, communicated to patient that this is not the first line of therapy for this condition and rarely prescribe it.   - For severe migraine ordered Uberelvy 50mg . Dose may be repeated in 2 hours. Max 2 tablets a day. Total of 10 tablets a month. Compazine (total of 12 tablets a month) and benadryl. May add additional migrelief.     Orders Placed This Encounter   ??? ubrogepant (UBRELVY) 50 mg tablet     FOLLOWUP PLAN  Return in about 6 months (around 08/14/2019).  The patient is instructed to contact me if there are any concerns with the agreed plan.        Total time 30 minutes.  Estimated counseling time was more than 50 % of the visit time. Counseled patient regarding migraine headache and dizziness.    Linden Dolin, MD  Clinical Assistant Professor   [employer] [facility]

## 2019-02-12 ENCOUNTER — Encounter: Admit: 2019-02-12 | Discharge: 2019-02-12

## 2019-02-12 ENCOUNTER — Ambulatory Visit: Admit: 2019-02-11 | Discharge: 2019-02-12

## 2019-02-12 DIAGNOSIS — G43109 Migraine with aura, not intractable, without status migrainosus: Secondary | ICD-10-CM

## 2019-02-12 MED ORDER — VENLAFAXINE 37.5 MG PO CP24
37.5 mg | ORAL_CAPSULE | Freq: Every day | ORAL | 0 refills | Status: DC
Start: 2019-02-12 — End: 2019-03-16

## 2019-02-12 NOTE — Telephone Encounter
Received email from pt stating her pharmacy informed her that a PA was needed for Ubrelvy. Called pt's pharmacy to obtain pt's insurance information: ID- [id number], BIN- [id number], PCN [id number], Group- [id number]. Completed PA on [email or web address] and attached clinicals. Per [email or web address], "Your information has been sent to [employer]."

## 2019-02-12 NOTE — Telephone Encounter
Received fax from [employer] with PA approval for Ubrelvy 50mg  tablet. Approved coverage for non-formulary from 01/13/2019 until 02/12/2020. "It cannot be considered for a lower copayment because it is being provided to your patient through an approved formulary exception." Case ID: [id number]. Notified pt via mychart of the approval.

## 2019-02-12 NOTE — Telephone Encounter
Dr. Gray Bernhardt, please advise. Pt is not eligible for the copay cards.

## 2019-02-15 MED ORDER — INDOMETHACIN 50 MG PO CAP
50 mg | ORAL_CAPSULE | Freq: Two times a day (BID) | ORAL | 0 refills | 15.50000 days | Status: DC | PRN
Start: 2019-02-15 — End: 2019-04-28

## 2019-02-15 NOTE — Telephone Encounter
Can we try Nuertec 75mg  for severe headache. Total of 9 dose per month.

## 2019-02-15 NOTE — Telephone Encounter
She can use combination between indomethacin 50mg  twice daily for a total of 10 tablets. She can use the compazine and benadryl to help with the severe migraines. Avoid use of these medication more than 3 days per week. Thanks.

## 2019-03-16 ENCOUNTER — Encounter: Admit: 2019-03-16 | Discharge: 2019-03-16

## 2019-03-16 MED ORDER — VENLAFAXINE 37.5 MG PO CP24
ORAL_CAPSULE | Freq: Every day | 5 refills | Status: DC
Start: 2019-03-16 — End: 2019-03-31

## 2019-03-29 ENCOUNTER — Encounter: Admit: 2019-03-29 | Discharge: 2019-03-29

## 2019-03-30 NOTE — Telephone Encounter
Dr. Gray Bernhardt please see pt's update. Dr. Jennelle Human ENT appt is scheduled for Sept. 9th. Do you have any other ideas to why she is having these symptoms? Any next steps?

## 2019-03-31 MED ORDER — VENLAFAXINE 75 MG PO CP24
75 mg | ORAL_CAPSULE | Freq: Every day | ORAL | 4 refills | Status: DC
Start: 2019-03-31 — End: 2019-09-06

## 2019-03-31 NOTE — Telephone Encounter
The dizziness and nausea could possibly be from vestibular migraine but not the sore throat or bilateral ear pain. We can increase venlafaxine to 75mg  daily to help with vestibular migraine symptoms. Thanks.

## 2019-04-18 ENCOUNTER — Encounter: Admit: 2019-04-18 | Discharge: 2019-04-18

## 2019-04-23 MED ORDER — METHYLPREDNISOLONE 4 MG PO DSPK
ORAL_TABLET | 0 refills | Status: DC
Start: 2019-04-23 — End: 2019-09-02

## 2019-04-28 ENCOUNTER — Encounter: Admit: 2019-04-28 | Discharge: 2019-04-28 | Payer: MEDICARE

## 2019-04-28 MED ORDER — INDOMETHACIN 50 MG PO CAP
ORAL_CAPSULE | Freq: Two times a day (BID) | ORAL | 2 refills | 17.50000 days | Status: DC | PRN
Start: 2019-04-28 — End: 2019-09-08

## 2019-06-09 ENCOUNTER — Encounter: Admit: 2019-06-09 | Discharge: 2019-06-09 | Payer: MEDICARE

## 2019-06-09 NOTE — Progress Notes
Faxed [place] Abd complete order to [facility] [phone]. Facility to call pt to schedule. Confirmation received.

## 2019-06-29 ENCOUNTER — Encounter: Admit: 2019-06-29 | Discharge: 2019-06-29 | Payer: MEDICARE

## 2019-06-29 NOTE — Progress Notes
Faxed [place] complete order to [facility] [phone] through [place]. Request facility to call pt. Imaging due in Dec.

## 2019-07-22 ENCOUNTER — Encounter: Admit: 2019-07-22 | Discharge: 2019-07-22 | Payer: MEDICARE

## 2019-07-22 NOTE — Telephone Encounter
Returned call to pt. Pt experiencing sharp pain since [place] on Monday. Has taken Tylenol and ibuprofen with little relief. Pt had 1 tramadol tablet, which did help the pain. Pt will try topical Aspercreme with lidocaine. Normal bowel movement, eating and drinking same. Pt did state the [place] tech used forceful manipulation of the abdomen to get quality images of organs.

## 2019-07-22 NOTE — Telephone Encounter
Pt requested a call back.  Stated not feeling well after ultra sound. Marland Kitchen

## 2019-07-22 NOTE — Telephone Encounter
Requested [facility] cloud [place] images.    Returned call to pt, explained with liver disease intermittent RUQ is not uncommon. Recommend ED visit if pain worsens. Pt v/u.

## 2019-07-23 ENCOUNTER — Encounter: Admit: 2019-07-23 | Discharge: 2019-07-23 | Payer: MEDICARE

## 2019-07-23 DIAGNOSIS — K7469 Other cirrhosis of liver: Secondary | ICD-10-CM

## 2019-07-24 ENCOUNTER — Encounter: Admit: 2019-07-24 | Discharge: 2019-07-24 | Payer: MEDICARE

## 2019-08-13 ENCOUNTER — Encounter: Admit: 2019-08-13 | Discharge: 2019-08-13 | Payer: MEDICARE

## 2019-08-27 ENCOUNTER — Encounter: Admit: 2019-08-27 | Discharge: 2019-08-27 | Payer: MEDICARE

## 2019-09-01 ENCOUNTER — Encounter: Admit: 2019-09-01 | Discharge: 2019-09-01 | Payer: MEDICARE

## 2019-09-01 DIAGNOSIS — K7469 Other cirrhosis of liver: Secondary | ICD-10-CM

## 2019-09-01 LAB — COMPREHENSIVE METABOLIC PANEL
Lab: 38 — ABNORMAL LOW
Lab: 8.1 % — ABNORMAL LOW (ref 24–44)

## 2019-09-01 LAB — CBC AND DIFF
Lab: 163 — ABNORMAL LOW
Lab: 29 — ABNORMAL HIGH
Lab: 10 % — ABNORMAL LOW (ref >60)
Lab: 14 K/UL — ABNORMAL HIGH (ref 1.8–7.0)
Lab: 3.5 % — ABNORMAL LOW (ref >60)
Lab: 31 % — ABNORMAL LOW
Lab: 32 — ABNORMAL HIGH
Lab: 7.7 % — ABNORMAL LOW (ref 4–12)
Lab: 91

## 2019-09-01 NOTE — Telephone Encounter
Called PCP to f/u if pt had any labs drawn recently. They state she did not complete all of our lab orders but do have recent CBC and CMP. Will fax to our office.

## 2019-09-02 ENCOUNTER — Encounter: Admit: 2019-09-02 | Discharge: 2019-09-02 | Payer: MEDICARE

## 2019-09-02 NOTE — Telephone Encounter
Patient called in to advise she was in the hospital at [facility] in [place], [place]. They will have labs

## 2019-09-03 ENCOUNTER — Encounter: Admit: 2019-09-03 | Discharge: 2019-09-03 | Payer: MEDICARE

## 2019-09-08 MED ORDER — TOPIRAMATE 25 MG PO TAB
25 mg | ORAL_TABLET | Freq: Every evening | ORAL | 1 refills | Status: DC
Start: 2019-09-08 — End: 2019-10-18

## 2019-09-22 ENCOUNTER — Encounter: Admit: 2019-09-22 | Discharge: 2019-09-22 | Payer: MEDICARE

## 2019-09-22 DIAGNOSIS — K7581 Nonalcoholic steatohepatitis (NASH): Secondary | ICD-10-CM

## 2019-09-22 LAB — VITAMIN A: Lab: 48

## 2019-09-22 LAB — COMPREHENSIVE METABOLIC PANEL
Lab: 0.4
Lab: 110 — ABNORMAL HIGH
Lab: 140
Lab: 182 — ABNORMAL HIGH
Lab: 24
Lab: 3.5
Lab: 5
Lab: 6.4
Lab: 8.1 — ABNORMAL LOW

## 2019-09-22 LAB — ALPHA FETO PROTEIN (AFP): Lab: 1.6

## 2019-09-22 LAB — 25-OH VITAMIN D (D2 + D3): Lab: 22 mg/dL — ABNORMAL HIGH (ref 0.4–1.00)

## 2019-09-22 LAB — PROTIME INR (PT): Lab: 0.9

## 2019-09-23 ENCOUNTER — Encounter: Admit: 2019-09-23 | Discharge: 2019-09-23 | Payer: MEDICARE

## 2019-09-24 ENCOUNTER — Encounter: Admit: 2019-09-24 | Discharge: 2019-09-24 | Payer: MEDICARE

## 2019-09-27 MED ORDER — ERGOCALCIFEROL (VITAMIN D2) 1,250 MCG (50,000 UNIT) PO CAP
1 | ORAL_CAPSULE | ORAL | 0 refills | 56.00000 days | Status: DC
Start: 2019-09-27 — End: 2020-03-09

## 2019-09-28 ENCOUNTER — Encounter: Admit: 2019-09-28 | Discharge: 2019-09-28 | Payer: MEDICARE

## 2019-10-05 ENCOUNTER — Encounter: Admit: 2019-10-05 | Discharge: 2019-10-05 | Payer: MEDICARE

## 2019-10-05 ENCOUNTER — Ambulatory Visit: Admit: 2019-10-05 | Discharge: 2019-10-06 | Payer: MEDICARE

## 2019-10-05 DIAGNOSIS — F99 Mental disorder, not otherwise specified: Secondary | ICD-10-CM

## 2019-10-05 DIAGNOSIS — G43909 Migraine, unspecified, not intractable, without status migrainosus: Secondary | ICD-10-CM

## 2019-10-05 DIAGNOSIS — K7460 Unspecified cirrhosis of liver: Secondary | ICD-10-CM

## 2019-10-05 DIAGNOSIS — E079 Disorder of thyroid, unspecified: Secondary | ICD-10-CM

## 2019-10-05 DIAGNOSIS — J45909 Unspecified asthma, uncomplicated: Secondary | ICD-10-CM

## 2019-10-05 DIAGNOSIS — G43809 Other migraine, not intractable, without status migrainosus: Secondary | ICD-10-CM

## 2019-10-05 NOTE — Progress Notes
Obtained patient's verbal consent to treat them and their agreement to [facility] financial policy and NPP via this telehealth visit during the [facility]    Date of Service: 10/05/2019    Subjective:             Alexis Mcconnell is a 54 y.o. female.    Referral Physician: Rowland Lathe, DO     History of Present Illness  Alexis Mcconnell 54 y.o. 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     Patient was last seen in clinic on 02/2019 for vestibular migraine and was started on effexor and decreased on amitriptyline. The effexor did not help and was switched to another antidepressant for mood treatment. She was then started on topiramate for migraine prevention. She reports that the weather change has been triggering her migraine. She was in a hospital in January for depression and was switched off effexor. She was switched to topiramate in the past month. She had episode of headache that lasted 4 days. The headache is usually located in the posterior region at the base of skull on the left side and feels like throbbing. She has photophobia, phonophobia and nausea. She reports that her mood is stable.     Review of Records:     Medical History:   Diagnosis Date   ? Asthma    ? Disorder of thyroid gland    ? Liver cirrhosis (HCC)     diagnosed in [place]   ? Migraines    ? Psychiatric illness        Surgical History:   Procedure Laterality Date   ? ESOPHAGEAL DILATATION  2015    x2   ? ESOPHAGOGASTRODUODENOSCOPY N/A 01/10/2017    Performed by Dawna Part, MD at [facility] ENDO   ? COLONOSCOPY N/A 01/10/2017    Performed by Dawna Part, MD at [facility] ENDO   ? ESOPHAGOGASTRODUODENOSCOPY EXCISION LESION  01/10/2017    Performed by Dawna Part, MD at [facility] ENDO   ? COLONOSCOPY EXCISION LESION  01/10/2017    Performed by Dawna Part, MD at [facility] ENDO   ? ESOPHAGOGASTRODUODENOSCOPY N/A 04/10/2017    Performed by Maryan Char, MD at [facility] ENDO   ? ESOPHAGOGASTRODUODENOSCOPY BIOPSY  04/10/2017    Performed by Maryan Char, MD at [facility] ENDO   ? ABDOMEN SURGERY     ? BARIATRIC SURGERY     ? HX CHOLECYSTECTOMY     ? HX HYSTERECTOMY           Social History     Socioeconomic History   ? Marital status: Married     Spouse name: Not on file   ? Number of children: Not on file   ? Years of education: Not on file   ? Highest education level: Not on file   Occupational History   ? Not on file   Tobacco Use   ? Smoking status: Former Smoker     Quit date: 03/18/1998     Years since quitting: 21.5   ? Smokeless tobacco: Never Used   Substance and Sexual Activity   ? Alcohol use: No   ? Drug use: No   ? Sexual activity: Not Currently   Other Topics Concern   ? Not on file   Social History Narrative   ? Not on file  Family History   Problem Relation Age of Onset   ? COPD Mother    ? Coronary Artery Disease Father        Allergies   Allergen Reactions   ? Citrus Fruit [Citrus And Derivatives] HEADACHE   ? 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 Iv Contrast Dye, Iodine Containing [Iodinated Contrast Media] RASH   ? Levaquin [Levofloxacin] RASH   ? Rocephin [Ceftriaxone] RASH   ? Sulfa (Sulfonamide Antibiotics) RASH   ? Citric Acid HEADACHE   ? Lovenox [Enoxaparin] AGITATION   ? Propofol SEE COMMENTS     Per [facility] 2010 records, postoperative sepsis and acute renal failure necessitating dialysis likely caused by lasix and hypoperfusion, rhabdomyolysis.  Differential diagnosis included propofol infusion syndrome.         Review of Systems   Constitutional: Negative.    HENT: Negative.    Eyes: Negative.    Respiratory: Negative.    Cardiovascular: Negative.    Gastrointestinal: Negative.    Endocrine: Negative.    Genitourinary: Negative.    Musculoskeletal: Negative.    Skin: Negative.    Allergic/Immunologic: Positive for food allergies.   Neurological: Positive for dizziness and headaches.   Hematological: Negative.    Psychiatric/Behavioral: The patient is nervous/anxious.          Objective:         ? acetaminophen (TYLENOL) 325 mg tablet Take 650 mg by mouth every 4 hours as needed for Pain.   ? baclofen (LIORESAL) 10 mg tablet Take 10 mg by mouth.   ? Calcium Citrate-Vitamin D3 (CALCIUM CITRATE + D) 315-200 mg-unit tab Take 1 tablet by mouth three times daily. Give separately from multivitamin dose   ? ergocalciferol (VITAMIN D) 1,250 mcg (50,000 unit) capsule Take one capsule by mouth every 7 days.   ? escitalopram oxalate (LEXAPRO) 20 mg tablet Take 20 mg by mouth daily.   ? gabapentin (NEURONTIN) 800 mg tablet Take 800 mg by mouth three times daily.   ? insulin aspart U-100 (NOVOLOG FLEXPEN U-100 INSULIN) 100 unit/mL injection PEN Inject 30 Units under the skin three times daily with meals.   ? insulin detemir (LEVEMIR FLEXPEN SC) Inject 35 Units under the skin twice daily.   ? levothyroxine (SYNTHROID) 175 mcg tablet Take 175 mcg by mouth daily 30 minutes before breakfast.   ? MULTIVITAMIN PO Take 3 capsules by mouth daily with breakfast. give separately from calcium citrate doses.   ? ondansetron (ZOFRAN ODT) 4 mg rapid dissolve tablet Dissolve one tablet by mouth every 8 hours as needed for Nausea or Vomiting. Place on tongue to disolve.   ? prochlorperazine (COMPAZINE) 5 mg tablet TAKE 1 TABLET BY MOUTH EVERY 6 HOURS AS NEEDED NAUSEA. FOR SEVERE MIGRAINE, USE NO MORE THAN 3 TABLETS PER DAY OR 12 TABLETS PER MONTH   ? promethazine (PHENERGAN) 25 mg tablet    ? sodium chloride (SALINE MIST) 0.65 % nasal spray Apply 1-2 sprays to each nostril as directed as Needed.   ? topiramate (TOPAMAX) 25 mg tablet Take one tablet by mouth at bedtime daily. For one week, then increase to one tablet twice daily.     There were no vitals filed for this visit.  There is no height or weight on file to calculate BMI.     Physical Exam General: no acute distress, cooperative    HEENT: normocephalic, atraumatic   CV: well perfused  Pulm: equal chest rise, non labored breathing    Neurological Exam   Mental status: Alert and oriented to time, place, person and situation.  Speech: Fluent, with normal naming, comprehension, articulation and repetition  CN II-XII: EOMI. Symmetrical facial movement. Hearing grossly intact.   Motor: Moves upper extremities equally.   Coordination/ fine movement: Normal finger to nose, heel to shin.       Assessment and Plan:  Alexis Mcconnell 54 y.o. 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     1. Vestibular migraine       Tried medication  - Amitriptyline provided no benefit.   - Patient is not a candidate for triptans due to risk of coronary artery disease in the setting diabetes.   - Effexor did not help the migraine much.     RECOMMENDATIONS  - Advised adequate hydration to avoid dehydration related headache.   - Continue migrelief that contains magnesium, riboflavin and feverfew.  - Continue topiramate 25mg  twice daily for migraine prevention.   - For severe migraine she takes indomethacin, compazine and benadryl. Avoid use more than 3 days in a week.        FOLLOWUP PLAN  Return in about 6 months (around 04/06/2020).  The patient is instructed to contact me if there are any concerns with the agreed plan.    Problem   Vestibular Migraine      Total time 17 minutes.  Estimated counseling time was more than 50% of the visit time. Counseled patient regarding migraine headache and dizziness.       Linden Dolin, MD  Clinical Assistant Professor   [employer] [facility] [employer]

## 2019-10-05 NOTE — Patient Instructions
Catalina Lunger Bennette 54 y.o. 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     1. Vestibular migraine       Tried medication  - Amitriptyline provided no benefit.   - Patient is not a candidate for triptans due to risk of coronary artery disease in the setting diabetes.   - Effexor did not help the migraine much.     RECOMMENDATIONS  - Advised adequate hydration to avoid dehydration related headache.   - Continue migrelief that contains magnesium, riboflavin and feverfew.  - Continue topiramate 25mg  twice daily for migraine prevention.   - For severe migraine she takes indomethacin, compazine and benadryl. Avoid use more than 3 days in a week.        FOLLOWUP PLAN  Return in about 6 months (around 04/06/2020).  The patient is instructed to contact me if there are any concerns with the agreed plan.

## 2019-10-05 NOTE — Progress Notes
Obtained patient's verbal consent to treat them and their agreement to [place] financial policy and NPP via this telehealth visit during the [facility] Emergency

## 2019-10-13 ENCOUNTER — Encounter: Admit: 2019-10-13 | Discharge: 2019-10-13 | Payer: MEDICARE

## 2019-10-14 ENCOUNTER — Encounter: Admit: 2019-10-14 | Discharge: 2019-10-14 | Payer: MEDICARE

## 2019-10-14 MED ORDER — FUROSEMIDE 40 MG PO TAB
40 mg | ORAL_TABLET | Freq: Every morning | ORAL | 11 refills | 90.00000 days | Status: DC
Start: 2019-10-14 — End: 2019-10-29

## 2019-10-14 NOTE — Telephone Encounter
Called pt in response to MyChart message re: swelling and recent weight gain. Discussed that Dr. Jeannine Kitten would like to start her on 40mg  of lasix once a day. Will plan to recheck labs in 10 days. Requesting lab order be sent to PCP.    CMP faxed to Dr. Barbera Setters office [phone]

## 2019-10-14 NOTE — Progress Notes
Fax U/S ABD orders due June to [facility] @ [phone]. Facility will contact pt to schedule. Requested return fax with date and time to [phone].

## 2019-10-18 ENCOUNTER — Encounter: Admit: 2019-10-18 | Discharge: 2019-10-18 | Payer: MEDICARE

## 2019-10-18 MED ORDER — TOPIRAMATE 25 MG PO TAB
25 mg | ORAL_TABLET | Freq: Two times a day (BID) | ORAL | 5 refills | Status: DC
Start: 2019-10-18 — End: 2019-11-10

## 2019-10-26 ENCOUNTER — Encounter: Admit: 2019-10-26 | Discharge: 2019-10-26 | Payer: MEDICARE

## 2019-10-28 ENCOUNTER — Encounter: Admit: 2019-10-28 | Discharge: 2019-10-28 | Payer: MEDICARE

## 2019-10-29 ENCOUNTER — Encounter: Admit: 2019-10-29 | Discharge: 2019-10-29 | Payer: MEDICARE

## 2019-10-29 DIAGNOSIS — K7469 Other cirrhosis of liver: Secondary | ICD-10-CM

## 2019-10-29 NOTE — Telephone Encounter
[place] to confirm fax number. Requesting OV note, demographics and referral be faxed to [phone]. They state this procedure will likely be scheduled with Dr. Cletus Gash and their office will reach out to patient to schedule.

## 2019-11-04 ENCOUNTER — Encounter: Admit: 2019-11-04 | Discharge: 2019-11-04 | Payer: MEDICARE

## 2019-11-05 ENCOUNTER — Encounter: Admit: 2019-11-05 | Discharge: 2019-11-05 | Payer: MEDICARE

## 2019-11-05 DIAGNOSIS — K7469 Other cirrhosis of liver: Secondary | ICD-10-CM

## 2019-11-05 LAB — COMPREHENSIVE METABOLIC PANEL
Lab: 0.3
Lab: 1.3 — ABNORMAL HIGH
Lab: 103
Lab: 140
Lab: 148 — ABNORMAL HIGH
Lab: 231 — ABNORMAL HIGH
Lab: 24
Lab: 29 — ABNORMAL HIGH
Lab: 3.7
Lab: 3.9
Lab: 30
Lab: 35
Lab: 41 — ABNORMAL LOW
Lab: 49 — ABNORMAL LOW
Lab: 6.4
Lab: 7
Lab: 9

## 2019-11-09 ENCOUNTER — Encounter: Admit: 2019-11-09 | Discharge: 2019-11-09 | Payer: MEDICARE

## 2019-11-10 ENCOUNTER — Encounter: Admit: 2019-11-10 | Discharge: 2019-11-10 | Payer: MEDICARE

## 2019-11-10 MED ORDER — TOPIRAMATE 50 MG PO TAB
50 mg | ORAL_TABLET | Freq: Two times a day (BID) | ORAL | 5 refills | Status: AC
Start: 2019-11-10 — End: ?

## 2019-11-10 NOTE — Telephone Encounter
We can increase topiramate to 50mg  twice daily. For the dizzy spells has patient tried meclizine? If not we can do meclizine 25mg  twice daily PRN. Thanks.

## 2019-11-11 MED ORDER — MECLIZINE 25 MG PO TAB
25 mg | ORAL_TABLET | Freq: Two times a day (BID) | ORAL | 0 refills | 10.00000 days | Status: AC | PRN
Start: 2019-11-11 — End: ?

## 2019-11-11 MED ORDER — PROCHLORPERAZINE MALEATE 5 MG PO TAB
5 mg | ORAL_TABLET | ORAL | 3 refills | Status: AC | PRN
Start: 2019-11-11 — End: ?

## 2020-01-16 ENCOUNTER — Encounter: Admit: 2020-01-16 | Discharge: 2020-01-16 | Payer: MEDICARE

## 2020-01-17 ENCOUNTER — Encounter: Admit: 2020-01-17 | Discharge: 2020-01-17 | Payer: MEDICARE

## 2020-01-17 DIAGNOSIS — K7581 Nonalcoholic steatohepatitis (NASH): Secondary | ICD-10-CM

## 2020-01-17 NOTE — Progress Notes
Lab orders faxed to Dr. Marily Lente office. Due August.

## 2020-01-18 ENCOUNTER — Encounter: Admit: 2020-01-18 | Discharge: 2020-01-18 | Payer: MEDICARE

## 2020-01-19 ENCOUNTER — Encounter: Admit: 2020-01-19 | Discharge: 2020-01-19 | Payer: MEDICARE

## 2020-01-19 DIAGNOSIS — K7581 Nonalcoholic steatohepatitis (NASH): Secondary | ICD-10-CM

## 2020-01-19 NOTE — Telephone Encounter
Called and spoke w/ pt. [facility] pt TH from 8/4 w/ Dr. Constance Goltz to 8/5 w/ Winifred Olive APRN d/t Provider schedule.

## 2020-02-09 ENCOUNTER — Encounter: Admit: 2020-02-09 | Discharge: 2020-02-09 | Payer: MEDICARE

## 2020-02-14 ENCOUNTER — Encounter: Admit: 2020-02-14 | Discharge: 2020-02-14 | Payer: MEDICARE

## 2020-02-23 ENCOUNTER — Encounter: Admit: 2020-02-23 | Discharge: 2020-02-23 | Payer: MEDICARE

## 2020-02-23 NOTE — Telephone Encounter
Faxed lab orders to PCP via O2.

## 2020-03-03 ENCOUNTER — Encounter: Admit: 2020-03-03 | Discharge: 2020-03-03 | Payer: MEDICARE

## 2020-03-05 ENCOUNTER — Encounter: Admit: 2020-03-05 | Discharge: 2020-03-05 | Payer: MEDICARE

## 2020-03-05 DIAGNOSIS — K7581 Nonalcoholic steatohepatitis (NASH): Secondary | ICD-10-CM

## 2020-03-05 LAB — COMPREHENSIVE METABOLIC PANEL
Lab: 106
Lab: 3.8
Lab: 48 — CL
Lab: 6 — AB
Lab: 1.2
Lab: 138
Lab: 149 — ABNORMAL HIGH
Lab: 24 — ABNORMAL HIGH
Lab: 26
Lab: 268 — ABNORMAL HIGH
Lab: 31 — ABNORMAL HIGH
Lab: 55 — CL
Lab: 57 — ABNORMAL HIGH
Lab: 6.3 — ABNORMAL LOW

## 2020-03-05 LAB — ALPHA FETO PROTEIN (AFP): Lab: 2 — AB

## 2020-03-05 LAB — 25-OH VITAMIN D (D2 + D3): Lab: 23 — CL

## 2020-03-05 LAB — PROTIME INR (PT): Lab: 11 — ABNORMAL LOW

## 2020-03-05 LAB — CBC AND DIFF
Lab: 0
Lab: 6.9

## 2020-03-09 ENCOUNTER — Encounter: Admit: 2020-03-09 | Discharge: 2020-03-09 | Payer: MEDICARE

## 2020-03-09 DIAGNOSIS — F99 Mental disorder, not otherwise specified: Secondary | ICD-10-CM

## 2020-03-09 DIAGNOSIS — E079 Disorder of thyroid, unspecified: Secondary | ICD-10-CM

## 2020-03-09 DIAGNOSIS — G43909 Migraine, unspecified, not intractable, without status migrainosus: Secondary | ICD-10-CM

## 2020-03-09 DIAGNOSIS — J45909 Unspecified asthma, uncomplicated: Secondary | ICD-10-CM

## 2020-03-09 DIAGNOSIS — K7460 Unspecified cirrhosis of liver: Secondary | ICD-10-CM

## 2020-03-09 MED ORDER — ERGOCALCIFEROL (VITAMIN D2) 1,250 MCG (50,000 UNIT) PO CAP
1 | ORAL_CAPSULE | ORAL | 0 refills | 56.00000 days | Status: AC
Start: 2020-03-09 — End: ?

## 2020-03-09 MED ORDER — SPIRONOLACTONE 50 MG PO TAB
50 mg | ORAL_TABLET | Freq: Every day | ORAL | 1 refills | 90.00000 days | Status: AC
Start: 2020-03-09 — End: ?

## 2020-03-10 ENCOUNTER — Encounter: Admit: 2020-03-10 | Discharge: 2020-03-10 | Payer: MEDICARE

## 2020-03-13 ENCOUNTER — Encounter: Admit: 2020-03-13 | Discharge: 2020-03-13 | Payer: MEDICARE

## 2020-03-15 ENCOUNTER — Encounter: Admit: 2020-03-15 | Discharge: 2020-03-15 | Payer: MEDICARE

## 2020-03-15 NOTE — Telephone Encounter
Spoke with pt re: updated CMP. Pt states she will have labs drawn at PCP on 03/20/20. This nurse will request results on 03/22/20.

## 2020-03-23 ENCOUNTER — Encounter: Admit: 2020-03-23 | Discharge: 2020-03-23 | Payer: MEDICARE

## 2020-03-23 DIAGNOSIS — K7469 Other cirrhosis of liver: Secondary | ICD-10-CM

## 2020-03-23 DIAGNOSIS — K7581 Nonalcoholic steatohepatitis (NASH): Secondary | ICD-10-CM

## 2020-03-24 ENCOUNTER — Encounter: Admit: 2020-03-24 | Discharge: 2020-03-24 | Payer: MEDICARE

## 2020-03-24 DIAGNOSIS — K7581 Nonalcoholic steatohepatitis (NASH): Secondary | ICD-10-CM

## 2020-03-24 DIAGNOSIS — K7469 Other cirrhosis of liver: Secondary | ICD-10-CM

## 2020-03-28 ENCOUNTER — Encounter: Admit: 2020-03-28 | Discharge: 2020-03-28 | Payer: MEDICARE

## 2020-03-31 ENCOUNTER — Encounter: Admit: 2020-03-31 | Discharge: 2020-03-31 | Payer: MEDICARE

## 2020-03-31 NOTE — Telephone Encounter
Contacted pt via telephone to discuss recent labs and recommendations. Advise to stop KCL tabs. Pt states she is taking 50 mg spironolactone and 80 mg furosemide daily. She does take an additional 40 mg PRN for excessive swelling, but   it is rare. Request pt let us know if swelling increasing the point the additional dose is needed daily. Pt v/u. No questions at this time.

## 2020-04-11 ENCOUNTER — Encounter: Admit: 2020-04-11 | Discharge: 2020-04-11 | Payer: MEDICARE

## 2020-04-11 ENCOUNTER — Ambulatory Visit: Admit: 2020-04-11 | Discharge: 2020-04-12 | Payer: MEDICARE

## 2020-04-11 DIAGNOSIS — G43809 Other migraine, not intractable, without status migrainosus: Secondary | ICD-10-CM

## 2020-04-11 DIAGNOSIS — F99 Mental disorder, not otherwise specified: Secondary | ICD-10-CM

## 2020-04-11 DIAGNOSIS — K7460 Unspecified cirrhosis of liver: Secondary | ICD-10-CM

## 2020-04-11 DIAGNOSIS — J45909 Unspecified asthma, uncomplicated: Secondary | ICD-10-CM

## 2020-04-11 DIAGNOSIS — E079 Disorder of thyroid, unspecified: Secondary | ICD-10-CM

## 2020-04-11 DIAGNOSIS — G43909 Migraine, unspecified, not intractable, without status migrainosus: Secondary | ICD-10-CM

## 2020-04-11 NOTE — Progress Notes
Obtained patient's verbal consent to treat them and their agreement to [facility] financial policy and NPP via this telehealth visit during the [facility]    Date of Service: 04/11/2020    Subjective:             Alexis Mcconnell is a 54 y.o. female.    Referral Physician: Rowland Lathe, DO     History of Present Illness  Alexis Mcconnell 54 y.o. 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     Patient was last seen in clinic on 10/2019 for vestibular migraine and was continued on topiramate and migrelief. She developed brian fog and was presumed to be due to topiramate and currently being tapered off. She reports that she had migraine last night that resolved. She took the migraine cocktail which helped the migraine. The headache was locate on the left side top region. Its was throbbing and has mild photophobia, phonophobia, osmophobia and nausea.     Review of Records:     Medical History:   Diagnosis Date   ? Asthma    ? Disorder of thyroid gland    ? Liver cirrhosis (HCC)     diagnosed in [place]   ? Migraines    ? Psychiatric illness        Surgical History:   Procedure Laterality Date   ? ESOPHAGEAL DILATATION  2015    x2   ? ESOPHAGOGASTRODUODENOSCOPY N/A 01/10/2017    Performed by Dawna Part, MD at [facility] ENDO   ? COLONOSCOPY N/A 01/10/2017    Performed by Dawna Part, MD at [facility] ENDO   ? ESOPHAGOGASTRODUODENOSCOPY EXCISION LESION  01/10/2017    Performed by Dawna Part, MD at [facility] ENDO   ? COLONOSCOPY EXCISION LESION  01/10/2017    Performed by Dawna Part, MD at [facility] ENDO   ? ESOPHAGOGASTRODUODENOSCOPY N/A 04/10/2017    Performed by Maryan Char, MD at [facility] ENDO   ? ESOPHAGOGASTRODUODENOSCOPY BIOPSY  04/10/2017    Performed by Maryan Char, MD at [facility] ENDO   ? ABDOMEN SURGERY     ? BARIATRIC SURGERY     ? HX CHOLECYSTECTOMY ? HX HYSTERECTOMY           Social History     Socioeconomic History   ? Marital status: Married     Spouse name: Not on file   ? Number of children: Not on file   ? Years of education: Not on file   ? Highest education level: Not on file   Occupational History   ? Not on file   Tobacco Use   ? Smoking status: Former Smoker     Quit date: 03/18/1998     Years since quitting: 22.0   ? Smokeless tobacco: Never Used   Substance and Sexual Activity   ? Alcohol use: No   ? Drug use: No   ? Sexual activity: Not Currently   Other Topics Concern   ? Not on file   Social History Narrative   ? Not on file         Family History   Problem Relation Age of Onset   ? COPD Mother    ? Coronary Artery Disease Father        Allergies   Allergen Reactions   ? Citrus Fruit [Citrus And Derivatives] HEADACHE   ?  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 Iv Contrast Dye, Iodine Containing [Iodinated Contrast Media] RASH   ? Levaquin [Levofloxacin] RASH   ? Rocephin [Ceftriaxone] RASH   ? Sulfa (Sulfonamide Antibiotics) RASH   ? Citric Acid HEADACHE   ? Lovenox [Enoxaparin] AGITATION   ? Propofol SEE COMMENTS     Per [facility] 2010 records, postoperative sepsis and acute renal failure necessitating dialysis likely caused by lasix and hypoperfusion, rhabdomyolysis.  Differential diagnosis included propofol infusion syndrome.         Review of Systems   Constitutional: Negative.    HENT: Negative.    Eyes: Negative.    Respiratory: Negative.    Cardiovascular: Negative.    Gastrointestinal: Negative.    Endocrine: Negative.    Genitourinary: Negative.    Musculoskeletal: Negative.    Skin: Negative.    Allergic/Immunologic: Positive for food allergies.   Neurological: Positive for dizziness and headaches.   Hematological: Negative.    Psychiatric/Behavioral: The patient is nervous/anxious.          Objective:         ? acetaminophen (TYLENOL) 325 mg tablet Take 650 mg by mouth every 4 hours as needed for Pain.   ? acyclovir (ZOVIRAX) 400 mg tablet Take 400 mg by mouth every 12 hours.   ? ADVAIR DISKUS 250-50 mcg/dose inhalation disk    ? albuterol sulfate (PROAIR HFA) 90 mcg/actuation HFA aerosol inhaler    ? ARIPiprazole (ABILIFY) 10 mg tablet Take 10 mg by mouth daily.   ? azelastine-fluticasone (DYMISTA) 137-50 mcg/spray nasal spray Apply  to each nostril as directed twice daily.   ? baclofen (LIORESAL) 10 mg tablet Take 10 mg by mouth.   ? betamethasone dipropionate (DIPROLENE) 0.05 % topical cream Apply  topically to affected area twice daily.   ? buPROPion HCL SR (WELLBUTRIN SR) 150 mg tablet    ? Calcium Citrate-Vitamin D3 (CALCIUM CITRATE + D) 315-200 mg-unit tab Take 1 tablet by mouth three times daily. Give separately from multivitamin dose   ? cetirizine HCl (ZYRTEC PO) Take  by mouth.   ? ergocalciferol (vitamin D2) (VITAMIN D) 1,250 mcg (50,000 unit) capsule Take one capsule by mouth every 7 days.   ? escitalopram oxalate (LEXAPRO) 20 mg tablet Take 30 mg by mouth daily.   ? fluticasone propionate (FLONASE) 50 mcg/actuation nasal spray, suspension Apply 2 sprays to each nostril as directed daily. Shake bottle gently before using.   ? furosemide (LASIX) 80 mg tablet Take 80 mg by mouth every morning.   ? gabapentin (NEURONTIN) 800 mg tablet Take 1,200 mg by mouth three times daily.   ? HUMALOG KWIKPEN INSULIN 100 unit/mL injection PEN 16 Units.   ? hydroxyzine pamoate (VISTARIL PO) Take 25 mg by mouth.   ? indomethacin (INDOCIN) 25 mg capsule Take 25 mg by mouth three times daily. Take with food.   ? insulin glargine,hum.rec.anlog (LANTUS SC) Inject 20 Units under the skin twice daily.   ? ipratropium/albuterol (COMBIVENT RESPIMAT) 20-100 mcg/actuation inhaler Inhale 1 puff by mouth into the lungs four times daily.   ? L. acidophilus-L. rhamnosus (PROBIOTIC) 15 billion cell cap    ? levothyroxine (SYNTHROID) 175 mcg tablet Take 175 mcg by mouth daily 30 minutes before breakfast.   ? lisinopriL (ZESTRIL) 20 mg tablet    ? meclizine (ANTIVERT) 25 mg tablet Take one tablet by mouth twice daily as needed.   ? MULTIVITAMIN  PO Take 3 capsules by mouth daily with breakfast. give separately from calcium citrate doses.   ? ondansetron (ZOFRAN ODT) 4 mg rapid dissolve tablet Dissolve one tablet by mouth every 8 hours as needed for Nausea or Vomiting. Place on tongue to disolve.   ? pantoprazole DR (PROTONIX) 40 mg tablet Take 40 mg by mouth.   ? polyethylene glycol 3350 (MIRALAX) 17 gram/dose powder TWICE A DAY   ? prazosin (MINIPRESS) 2 mg capsule 4 mg.   ? prochlorperazine (COMPAZINE) 5 mg tablet Take one tablet by mouth every 6 hours as needed for Nausea. And severe migraine. Use no more than 3 per day max of 12 tablets per month.   ? promethazine (PHENERGAN) 25 mg tablet    ? pyridoxine (vitamin B6) 50 mg cap    ? senna/docusate (SENOKOT-S) 8.6/50 mg tablet Take  by mouth.   ? simethicone (GAS-X PO) Take  by mouth.   ? sodium chloride (SALINE MIST) 0.65 % nasal spray Apply 1-2 sprays to each nostril as directed as Needed.   ? spironolactone (ALDACTONE) 50 mg tablet Take one tablet by mouth daily. Take with food.   ? thiamine HCL (VITAMIN B-1) 100 mg tablet 1 Tab, daily, PO, 0 Number of Refills, TAB   ? topiramate (TOPAMAX) 25 mg tablet Take one tablet by mouth twice daily for 14 days, THEN one tablet at bedtime daily for 7 days. Then stop.   ? traMADoL (ULTRAM) 50 mg tablet 1 Tab, Q6H, PO, 30 Day(s), 60 Tab, 11/28/18, PRN, 0 Number of Refills, 0, for pain, Print Requisition, TAB   ? traZODone (DESYREL) 300 mg tablet Take 300 mg by mouth.     There were no vitals filed for this visit.  There is no height or weight on file to calculate BMI.     Physical Exam  General: no acute distress, cooperative    HEENT: normocephalic, atraumatic   CV: well perfused  Pulm: equal chest rise, non labored breathing    Neurological Exam   Mental status: Alert and oriented to time, place, person and situation.  Speech: Fluent, with normal naming, comprehension, articulation and repetition  CN II-XII: EOMI. Symmetrical facial movement. Hearing grossly intact.   Motor: Moves upper extremities equally.   Coordination/ fine movement: Normal finger to nose, heel to shin.       Assessment and Plan:  Alexis Mcconnell 54 y.o. 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     1. Vestibular migraine       Tried medication  - Amitriptyline provided no benefit.   - Patient is not a candidate for triptans due to risk of coronary artery disease in the setting diabetes.   - Effexor did not help the migraine much.   - Topiramate caused brain fog.     RECOMMENDATIONS  - Advised adequate hydration to avoid dehydration related headache.   - Continue migrelief that contains magnesium, riboflavin and feverfew.  - Continue with topiramate taper currently on 25mg  twice daily.   - For severe migraine she takes indomethacin, compazine and benadryl. Avoid use more than 3 days in a week.        FOLLOWUP PLAN  Return in about 6 months (around 10/09/2020).  The patient is instructed to contact me if there are any concerns with the agreed plan.  Total time 20 minutes.  Estimated counseling time was more than 50% of the visit time. Counseled patient regarding migraine headache and dizziness.       Linden Dolin, MD  Clinical Assistant Professor   [employer] [facility] [employer]

## 2020-04-13 ENCOUNTER — Encounter: Admit: 2020-04-13 | Discharge: 2020-04-13 | Payer: MEDICARE

## 2020-04-16 ENCOUNTER — Encounter: Admit: 2020-04-16 | Discharge: 2020-04-16 | Payer: MEDICARE

## 2020-04-24 ENCOUNTER — Encounter: Admit: 2020-04-24 | Discharge: 2020-04-24 | Payer: MEDICARE

## 2020-05-02 ENCOUNTER — Encounter: Admit: 2020-05-02 | Discharge: 2020-05-02 | Payer: MEDICARE

## 2020-05-02 ENCOUNTER — Ambulatory Visit: Admit: 2020-05-02 | Discharge: 2020-05-03 | Payer: MEDICARE

## 2020-05-02 DIAGNOSIS — K7460 Unspecified cirrhosis of liver: Secondary | ICD-10-CM

## 2020-05-02 DIAGNOSIS — G43709 Chronic migraine without aura, not intractable, without status migrainosus: Secondary | ICD-10-CM

## 2020-05-02 DIAGNOSIS — G43909 Migraine, unspecified, not intractable, without status migrainosus: Secondary | ICD-10-CM

## 2020-05-02 DIAGNOSIS — F99 Mental disorder, not otherwise specified: Secondary | ICD-10-CM

## 2020-05-02 DIAGNOSIS — J45909 Unspecified asthma, uncomplicated: Secondary | ICD-10-CM

## 2020-05-02 DIAGNOSIS — E079 Disorder of thyroid, unspecified: Secondary | ICD-10-CM

## 2020-05-02 DIAGNOSIS — F488 Other specified nonpsychotic mental disorders: Secondary | ICD-10-CM

## 2020-05-02 NOTE — Progress Notes
Obtained patient's verbal consent to treat them and their agreement to [facility] financial policy and NPP via this telehealth visit during the [facility] Emergency    Date of Service: 05/02/2020    Subjective:             Alexis Mcconnell is a 54 y.o. female.    Referral Physician: Rowland Lathe, DO     History of Present Illness  Alexis Mcconnell 55 y.o. 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     Patient was last seen on telehealth three weeks ago for vestibular migraine and brain fog and was being tapered off topiramate. She was then started to zonisamide to see if it helps the headaches but brain fog got worse. She continues to get headache since coming off topiramate and started taking zonisamide which helped the migraine but having side effects. The headache is located on the left side behind the eye and has throbbing quality. She has photophobia, phonophobia and nausea. She also has dizziness with spinning sensation. Regarding her cirrhosis of the liver she admits to having hepatic encephalopathy several years ago but has not had any exacerbation recently. She follows with hepatology.       Review of Records:     Medical History:   Diagnosis Date   ? Asthma    ? Disorder of thyroid gland    ? Liver cirrhosis (HCC)     diagnosed in [place]   ? Migraines    ? Psychiatric illness        Surgical History:   Procedure Laterality Date   ? ESOPHAGEAL DILATATION  2015    x2   ? ESOPHAGOGASTRODUODENOSCOPY N/A 01/10/2017    Performed by Dawna Part, MD at [facility] ENDO   ? COLONOSCOPY N/A 01/10/2017    Performed by Dawna Part, MD at [facility] ENDO   ? ESOPHAGOGASTRODUODENOSCOPY EXCISION LESION  01/10/2017    Performed by Dawna Part, MD at [facility] ENDO   ? COLONOSCOPY EXCISION LESION  01/10/2017    Performed by Dawna Part, MD at [facility] ENDO   ? ESOPHAGOGASTRODUODENOSCOPY N/A 04/10/2017    Performed by Maryan Char, MD at [facility] ENDO   ? ESOPHAGOGASTRODUODENOSCOPY BIOPSY  04/10/2017    Performed by Maryan Char, MD at [facility] ENDO   ? ABDOMEN SURGERY     ? BARIATRIC SURGERY     ? HX CHOLECYSTECTOMY     ? HX HYSTERECTOMY           Social History     Socioeconomic History   ? Marital status: Married     Spouse name: Not on file   ? Number of children: Not on file   ? Years of education: Not on file   ? Highest education level: Not on file   Occupational History   ? Not on file   Tobacco Use   ? Smoking status: Former Smoker     Quit date: 03/18/1998     Years since quitting: 22.1   ? Smokeless tobacco: Never Used   Substance and Sexual Activity   ? Alcohol use: No   ? Drug use: No   ? Sexual activity: Not Currently   Other Topics Concern   ? Not on file   Social History Narrative   ? Not on file  Family History   Problem Relation Age of Onset   ? COPD Mother    ? Coronary Artery Disease Father        Allergies   Allergen Reactions   ? Citrus Fruit [Citrus And Derivatives] HEADACHE   ? 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 Iv Contrast Dye, Iodine Containing [Iodinated Contrast Media] RASH   ? Levaquin [Levofloxacin] RASH   ? Rocephin [Ceftriaxone] RASH   ? Sulfa (Sulfonamide Antibiotics) RASH   ? Citric Acid HEADACHE   ? Lovenox [Enoxaparin] AGITATION   ? Propofol SEE COMMENTS     Per [facility] 2010 records, postoperative sepsis and acute renal failure necessitating dialysis likely caused by lasix and hypoperfusion, rhabdomyolysis.  Differential diagnosis included propofol infusion syndrome.         Review of Systems   Constitutional: Negative.    HENT: Negative.    Eyes: Negative.    Respiratory: Negative.    Cardiovascular: Negative.    Gastrointestinal: Negative.    Endocrine: Negative.    Genitourinary: Negative.    Musculoskeletal: Negative.    Skin: Negative.    Allergic/Immunologic: Positive for food allergies.   Neurological: Positive for dizziness and headaches.   Hematological: Negative.    Psychiatric/Behavioral: The patient is nervous/anxious.          Objective:         ? acetaminophen (TYLENOL) 325 mg tablet Take 650 mg by mouth every 4 hours as needed for Pain.   ? acyclovir (ZOVIRAX) 400 mg tablet Take 400 mg by mouth every 12 hours.   ? ADVAIR DISKUS 250-50 mcg/dose inhalation disk    ? albuterol sulfate (PROAIR HFA) 90 mcg/actuation HFA aerosol inhaler    ? ARIPiprazole (ABILIFY) 10 mg tablet Take 10 mg by mouth daily.   ? azelastine-fluticasone (DYMISTA) 137-50 mcg/spray nasal spray Apply  to each nostril as directed twice daily.   ? baclofen (LIORESAL) 10 mg tablet Take 10 mg by mouth.   ? betamethasone dipropionate (DIPROLENE) 0.05 % topical cream Apply  topically to affected area twice daily.   ? buPROPion HCL SR (WELLBUTRIN SR) 150 mg tablet 450 mg.   ? Calcium Citrate-Vitamin D3 (CALCIUM CITRATE + D) 315-200 mg-unit tab Take 1 tablet by mouth three times daily. Give separately from multivitamin dose   ? cetirizine HCl (ZYRTEC PO) Take  by mouth.   ? escitalopram oxalate (LEXAPRO) 20 mg tablet Take 20 mg by mouth daily. 1.5 tablet by mouth   ? fluticasone propionate (FLONASE) 50 mcg/actuation nasal spray, suspension Apply 2 sprays to each nostril as directed daily. Shake bottle gently before using.   ? furosemide (LASIX) 80 mg tablet Take 80 mg by mouth every morning.   ? gabapentin (NEURONTIN) 600 mg tablet Take 1,200 mg by mouth three times daily.   ? HUMALOG KWIKPEN INSULIN 100 unit/mL injection PEN 16 Units.   ? hydroxyzine pamoate (VISTARIL PO) Take 25 mg by mouth.   ? indomethacin (INDOCIN) 25 mg capsule Take 25 mg by mouth three times daily. Take with food.   ? insulin glargine,hum.rec.anlog (LANTUS SC) Inject 20 Units under the skin twice daily.   ? ipratropium/albuterol (COMBIVENT RESPIMAT) 20-100 mcg/actuation inhaler Inhale 1 puff by mouth into the lungs four times daily.   ? L. acidophilus-L. rhamnosus (PROBIOTIC) 15 billion cell cap    ? levothyroxine (SYNTHROID) 175 mcg tablet Take 175 mcg by mouth daily 30 minutes before breakfast.   ?  lisinopriL (ZESTRIL) 20 mg tablet    ? meclizine (ANTIVERT) 25 mg tablet Take one tablet by mouth twice daily as needed.   ? MULTIVITAMIN PO Take 3 capsules by mouth daily with breakfast. give separately from calcium citrate doses.   ? ondansetron (ZOFRAN ODT) 4 mg rapid dissolve tablet Dissolve one tablet by mouth every 8 hours as needed for Nausea or Vomiting. Place on tongue to disolve. (Patient taking differently: Dissolve 8 mg by mouth every 8 hours as needed for Nausea or Vomiting. Place on tongue to disolve. )   ? pantoprazole DR (PROTONIX) 40 mg tablet Take 40 mg by mouth.   ? polyethylene glycol 3350 (MIRALAX) 17 gram/dose powder TWICE A DAY   ? prazosin (MINIPRESS) 2 mg capsule 4 mg.   ? prochlorperazine (COMPAZINE) 5 mg tablet Take one tablet by mouth every 6 hours as needed for Nausea. And severe migraine. Use no more than 3 per day max of 12 tablets per month.   ? promethazine (PHENERGAN) 25 mg tablet    ? pyridoxine (vitamin B6) 50 mg cap    ? senna/docusate (SENOKOT-S) 8.6/50 mg tablet Take  by mouth.   ? simethicone (GAS-X PO) Take  by mouth.   ? sodium chloride (SALINE MIST) 0.65 % nasal spray Apply 1-2 sprays to each nostril as directed as Needed.   ? spironolactone (ALDACTONE) 50 mg tablet Take one tablet by mouth daily. Take with food.   ? traMADoL (ULTRAM) 50 mg tablet 1 Tab, Q6H, PO, 30 Day(s), 60 Tab, 11/28/18, PRN, 0 Number of Refills, 0, for pain, Print Requisition, TAB   ? traZODone (DESYREL) 300 mg tablet Take 300 mg by mouth.   ? zonisamide (ZONEGRAN) 25 mg capsule Start with 25mg  at night and increase to 50mg  at night for migraine prevention.     There were no vitals filed for this visit.  There is no height or weight on file to calculate BMI.     Physical Exam  General: no acute distress, cooperative    HEENT: normocephalic, atraumatic   CV: well perfused  Pulm: equal chest rise, non labored breathing    Neurological Exam   Mental status: Alert and oriented to time, place, person and situation.  Speech: Fluent, with normal naming, comprehension, articulation and repetition  CN II-XII: EOMI. Symmetrical facial movement. Hearing grossly intact.   Motor: Moves upper extremities equally.   Coordination/ fine movement: Normal finger to nose, heel to shin.       Assessment and Plan:  Maryruth Hancock Fini 54 y.o. 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     Impression: Chronic migraine and dizziness could presentation of vestibular migraine and currently having increased frequency since discontinuing topiramate. Brain fog is most likely due to medications (topiramate and now zonisamide)     1. Chronic migraine    2. Brain fog       Tried medication  - Amitriptyline provided no benefit.   - Patient is not a candidate for triptans due to risk of coronary artery disease in the setting diabetes.   - Effexor did not help the migraine much.   - Topiramate caused brain fog.   - Zonisamide caused brain fog.     RECOMMENDATIONS  - Advised adequate hydration to avoid dehydration related headache.   - Continue migrelief that contains magnesium, riboflavin  and feverfew.  - D/c Zonisamide due to adverse effect of brain fog.   - Start botox injection for migraine prevention.   - For severe migraine continue indomethacin, compazine and benadryl. Avoid use more than 3 days in a week.        FOLLOWUP PLAN  Return in about 6 months (around 10/30/2020).  The patient is instructed to contact me if there are any concerns with the agreed plan.    Problem   Chronic Migraine   Brain Fog      Total time 30 minutes.  Estimated counseling time was more than 50% of the visit time. Counseled patient regarding migraine headache and dizziness.       Linden Dolin, MD  Clinical Assistant Professor   [employer] [facility]

## 2020-05-02 NOTE — Progress Notes
Obtained patient's verbal consent to treat them and their agreement to [place] financial policy and NPP via this telehealth visit during the Coronavirus Public Health Emergency

## 2020-05-02 NOTE — Telephone Encounter
Patient provided Botox migraine protocol education by clinic nurse. Topics covered: benefits & risks, including most common side effects & realistic expectations; no indication in pregnancy & will not be administered if pregnant or trying to become pregnant; clinic prior authorization (PA) process; Botox Savings Program (if applicable). Patient will provided with take home Botox folder at her appt, including the following patient handouts: Detailed Migraine diary; Botox Pre Treatment Information; Post Procedure Patient Instructions; [employer]/product designer) brochures: Don?t Lie Down (including information about [employer] Program), Stand Up, What To Expect With Botox (including Summary of Information about Botox insert); Direct contact information for nurse given. All questions answered and clarification provided as needed. Patient verbalized understanding of all content discussed.

## 2020-05-30 ENCOUNTER — Encounter: Admit: 2020-05-30 | Discharge: 2020-05-30 | Payer: MEDICARE

## 2020-06-06 ENCOUNTER — Encounter: Admit: 2020-06-06 | Discharge: 2020-06-06 | Payer: MEDICARE

## 2020-06-13 ENCOUNTER — Encounter: Admit: 2020-06-13 | Discharge: 2020-06-13 | Payer: MEDICARE

## 2020-06-13 ENCOUNTER — Ambulatory Visit: Admit: 2020-06-13 | Discharge: 2020-06-14 | Payer: MEDICARE

## 2020-06-13 DIAGNOSIS — K7469 Other cirrhosis of liver: Secondary | ICD-10-CM

## 2020-06-13 DIAGNOSIS — K7460 Unspecified cirrhosis of liver: Secondary | ICD-10-CM

## 2020-06-13 DIAGNOSIS — E079 Disorder of thyroid, unspecified: Secondary | ICD-10-CM

## 2020-06-13 DIAGNOSIS — F99 Mental disorder, not otherwise specified: Secondary | ICD-10-CM

## 2020-06-13 DIAGNOSIS — G43909 Migraine, unspecified, not intractable, without status migrainosus: Secondary | ICD-10-CM

## 2020-06-13 DIAGNOSIS — J45909 Unspecified asthma, uncomplicated: Secondary | ICD-10-CM

## 2020-06-13 MED ORDER — SPIRONOLACTONE 50 MG PO TAB
100 mg | ORAL_TABLET | Freq: Every day | ORAL | 1 refills | 46.00000 days | Status: AC
Start: 2020-06-13 — End: ?

## 2020-06-13 MED ORDER — LACTULOSE 10 GRAM/15 ML PO SOLN
20 g | Freq: Two times a day (BID) | ORAL | 3 refills | 21.00000 days | Status: AC
Start: 2020-06-13 — End: ?

## 2020-06-13 NOTE — Progress Notes
Patient gave verbal report of self-obtained vital signs prior to Telehealth appointment. Medications, allergies, history, and screening questions reviewed with patient. Patient received the financial policy, consent to treat, and notice of privacy policies and provided verbal consent for this visit.
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
## 2020-06-20 ENCOUNTER — Encounter: Admit: 2020-06-20 | Discharge: 2020-06-20 | Payer: MEDICARE

## 2020-06-21 ENCOUNTER — Encounter: Admit: 2020-06-21 | Discharge: 2020-06-21 | Payer: MEDICARE

## 2020-06-23 ENCOUNTER — Encounter: Admit: 2020-06-23 | Discharge: 2020-06-23 | Payer: MEDICARE

## 2020-06-23 NOTE — Telephone Encounter
LVM returning call to patient regarding lactulose.

## 2020-06-23 NOTE — Telephone Encounter
Call from [place] who is an inpatient at [facility].  She is asking about as scrip for lactulose that she needs.  Please return her call.

## 2020-06-30 ENCOUNTER — Encounter: Admit: 2020-06-30 | Discharge: 2020-06-30 | Payer: MEDICARE

## 2020-07-03 ENCOUNTER — Encounter: Admit: 2020-07-03 | Discharge: 2020-07-03 | Payer: MEDICARE

## 2020-07-06 ENCOUNTER — Encounter: Admit: 2020-07-06 | Discharge: 2020-07-06 | Payer: MEDICARE

## 2020-07-06 ENCOUNTER — Ambulatory Visit: Admit: 2020-07-06 | Discharge: 2020-07-07 | Payer: MEDICARE

## 2020-07-06 DIAGNOSIS — K7460 Unspecified cirrhosis of liver: Secondary | ICD-10-CM

## 2020-07-06 DIAGNOSIS — G43909 Migraine, unspecified, not intractable, without status migrainosus: Secondary | ICD-10-CM

## 2020-07-06 DIAGNOSIS — E079 Disorder of thyroid, unspecified: Secondary | ICD-10-CM

## 2020-07-06 DIAGNOSIS — G43109 Migraine with aura, not intractable, without status migrainosus: Secondary | ICD-10-CM

## 2020-07-06 DIAGNOSIS — F99 Mental disorder, not otherwise specified: Secondary | ICD-10-CM

## 2020-07-06 DIAGNOSIS — J45909 Unspecified asthma, uncomplicated: Secondary | ICD-10-CM

## 2020-07-06 DIAGNOSIS — G250 Essential tremor: Secondary | ICD-10-CM

## 2020-07-06 MED ORDER — PRIMIDONE 50 MG PO TAB
50 mg | ORAL_TABLET | Freq: Every evening | ORAL | 3 refills | Status: AC
Start: 2020-07-06 — End: ?

## 2020-07-09 ENCOUNTER — Encounter: Admit: 2020-07-09 | Discharge: 2020-07-09 | Payer: MEDICARE

## 2020-07-11 ENCOUNTER — Encounter: Admit: 2020-07-11 | Discharge: 2020-07-11 | Payer: MEDICARE

## 2020-07-13 ENCOUNTER — Encounter: Admit: 2020-07-13 | Discharge: 2020-07-13 | Payer: MEDICARE

## 2020-07-13 DIAGNOSIS — K7460 Unspecified cirrhosis of liver: Secondary | ICD-10-CM

## 2020-07-13 DIAGNOSIS — K7469 Other cirrhosis of liver: Secondary | ICD-10-CM

## 2020-07-13 LAB — CBC AND DIFF
Lab: 0 %
Lab: 10 — ABNORMAL LOW
Lab: 25 %
Lab: 3.3 — ABNORMAL LOW
Lab: 3.9
Lab: 30
Lab: 30 — ABNORMAL LOW
Lab: 33 — ABNORMAL LOW
Lab: 6 %
Lab: 6.5
Lab: 62 %
Lab: 7 %
Lab: 98

## 2020-07-13 LAB — COMPREHENSIVE METABOLIC PANEL
Lab: 0.3
Lab: 1.8 — ABNORMAL HIGH
Lab: 108
Lab: 136
Lab: 155 — ABNORMAL HIGH
Lab: 22
Lab: 26
Lab: 27
Lab: 3.7
Lab: 31 — ABNORMAL LOW
Lab: 35 — ABNORMAL HIGH
Lab: 36 — ABNORMAL LOW
Lab: 5.7 — ABNORMAL HIGH
Lab: 6
Lab: 6.3
Lab: 8.6 — ABNORMAL HIGH
Lab: 92

## 2020-07-13 LAB — PROTIME INR (PT)
Lab: 0.9 — ABNORMAL LOW
Lab: 10

## 2020-07-18 ENCOUNTER — Encounter: Admit: 2020-07-18 | Discharge: 2020-07-18 | Payer: MEDICARE

## 2020-07-19 ENCOUNTER — Encounter: Admit: 2020-07-19 | Discharge: 2020-07-19 | Payer: MEDICARE

## 2020-07-19 DIAGNOSIS — K7469 Other cirrhosis of liver: Secondary | ICD-10-CM

## 2020-07-19 DIAGNOSIS — K7460 Unspecified cirrhosis of liver: Secondary | ICD-10-CM

## 2020-07-19 NOTE — Progress Notes
PMH: Fatty Liver Disease and Cirrhosis; NASH; Obesity; Gastric Bypass; Pancreatitis/Insuff; Asthma; CKD;     ED--> Saw PCP yesterday for on-going elevated K+  K+ elevated (6.5)    Meds Adjusted  Stopped Spirolactone  Stopped ACE  Lasix at home--> 40 BID     EKG-No acute changes; SR-R-63; no peaked -Waves     WBC-6.8  Hgb-9.8 (11.6 in August)  Plt-169 (107 in August)  INR-Pending  Na+139  K+6.8   Cl-113  CO2-20  GAp-13  BUN-20  Creat-1.56  Glu-252  Ca+8.2  Mg+2.0  Bili-0.29  AST-19  ALT-23  Alk Phos-126    Abd US--> small volume ascites     VS:  BP-145/51  P-60  RR-18  T-97.8  SpO2-96-98% on RA  MS: A/O x4    Meds:  Kayexalate  1L IVF  $Re'20mg'WPB$   IV Lasix

## 2020-07-20 ENCOUNTER — Encounter: Admit: 2020-07-20 | Discharge: 2020-07-20 | Payer: MEDICARE

## 2020-10-05 ENCOUNTER — Encounter: Admit: 2020-10-05 | Discharge: 2020-10-05 | Payer: MEDICARE

## 2020-10-31 ENCOUNTER — Encounter: Admit: 2020-10-31 | Discharge: 2020-10-31 | Payer: MEDICARE

## 2020-11-04 IMAGING — CR ABDOMEN
4 series · 4 of 4 positions shown · non-contrast
Comparison: none

[chest pa x-wise]
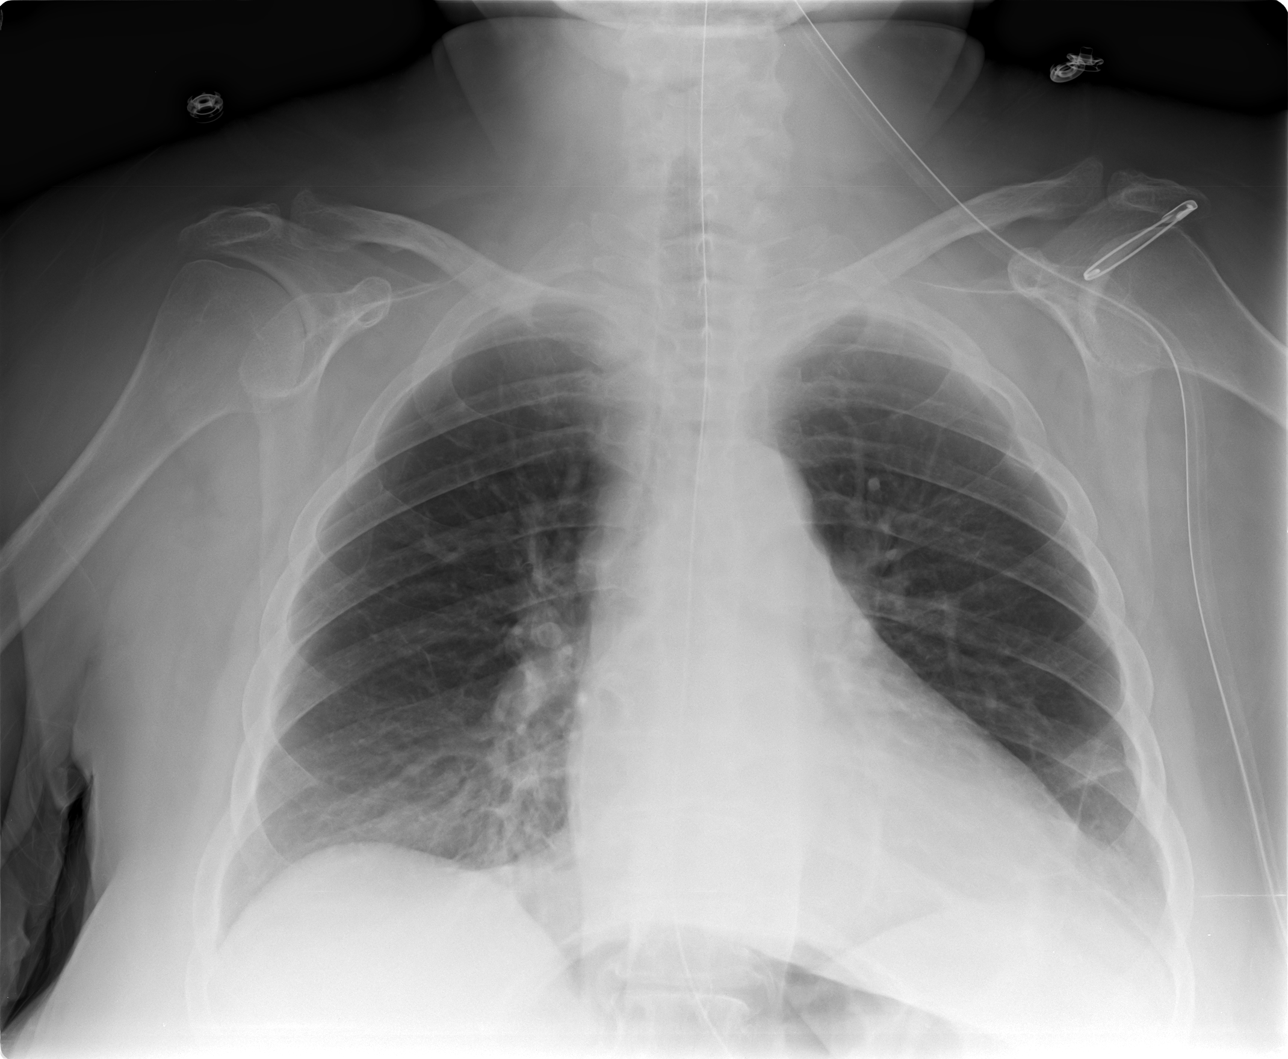

[abdomen upright]
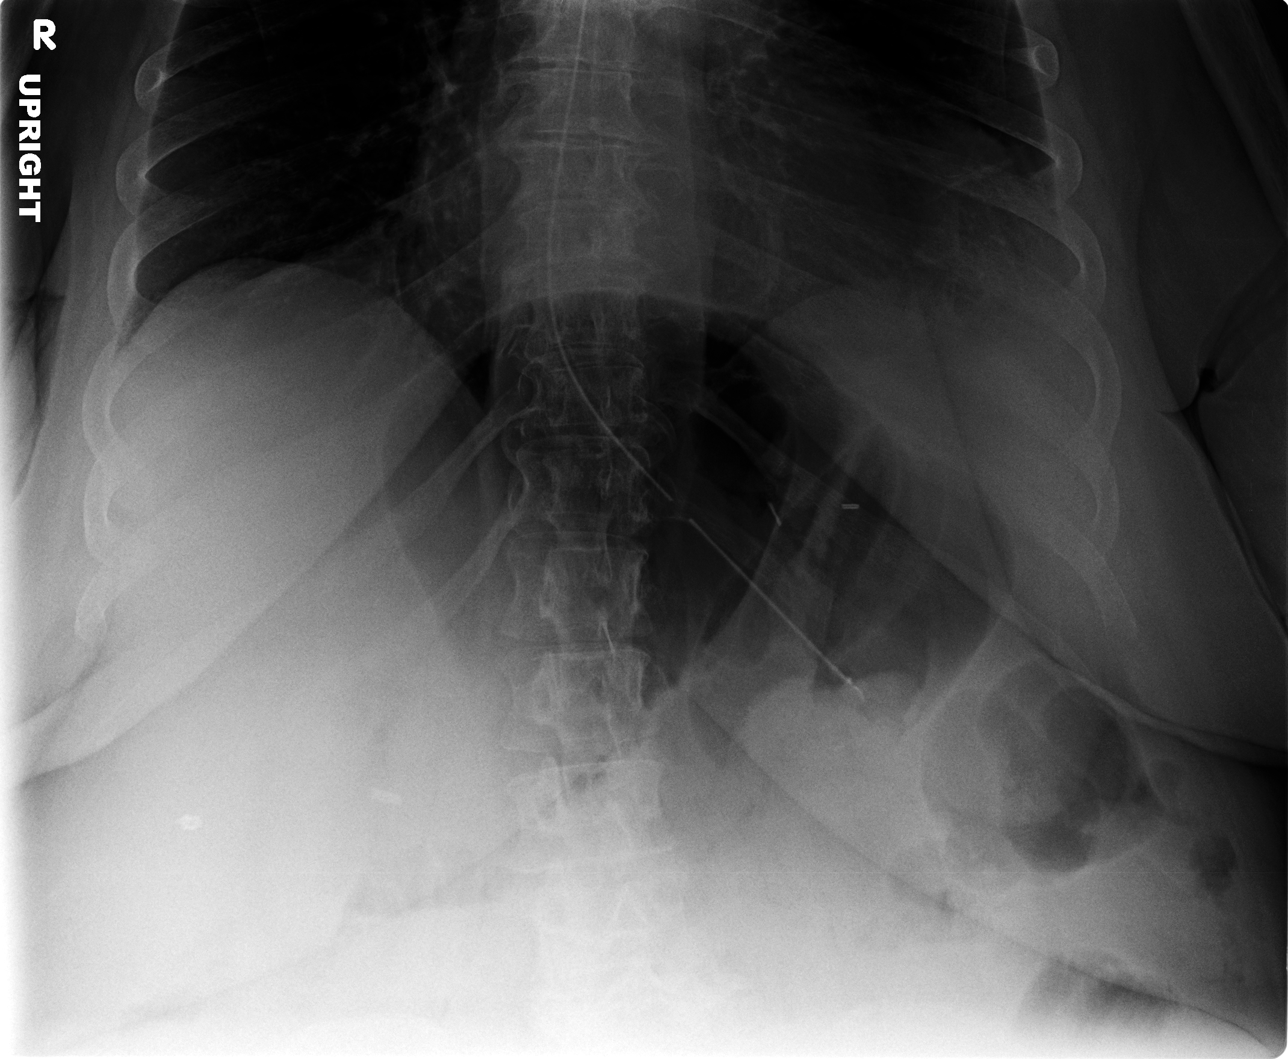

[abd supine x-wise (1 of 2)]
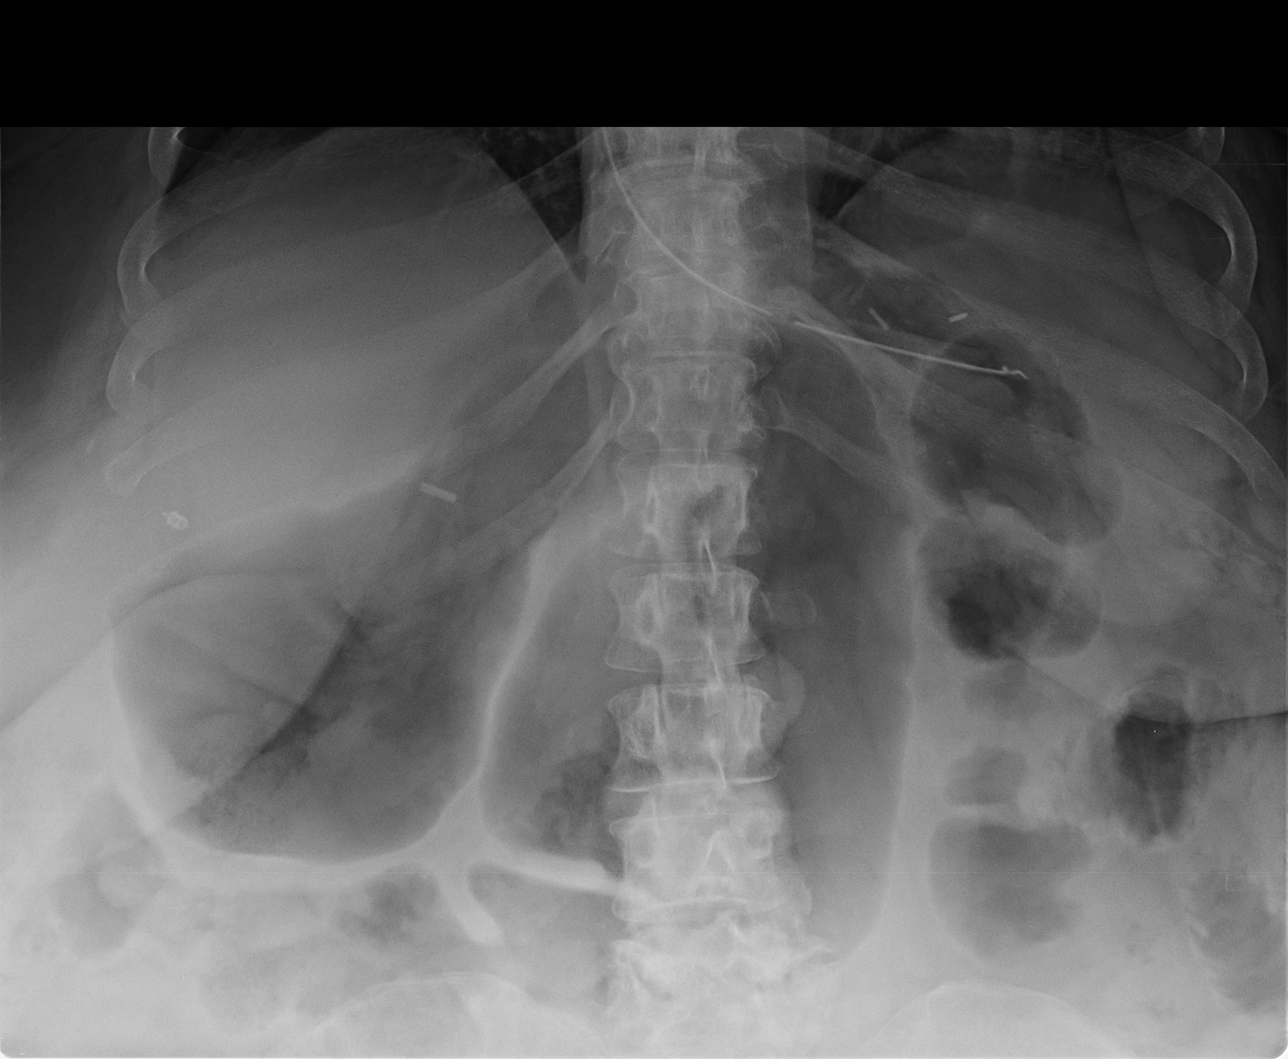

[abd supine x-wise (2 of 2)]
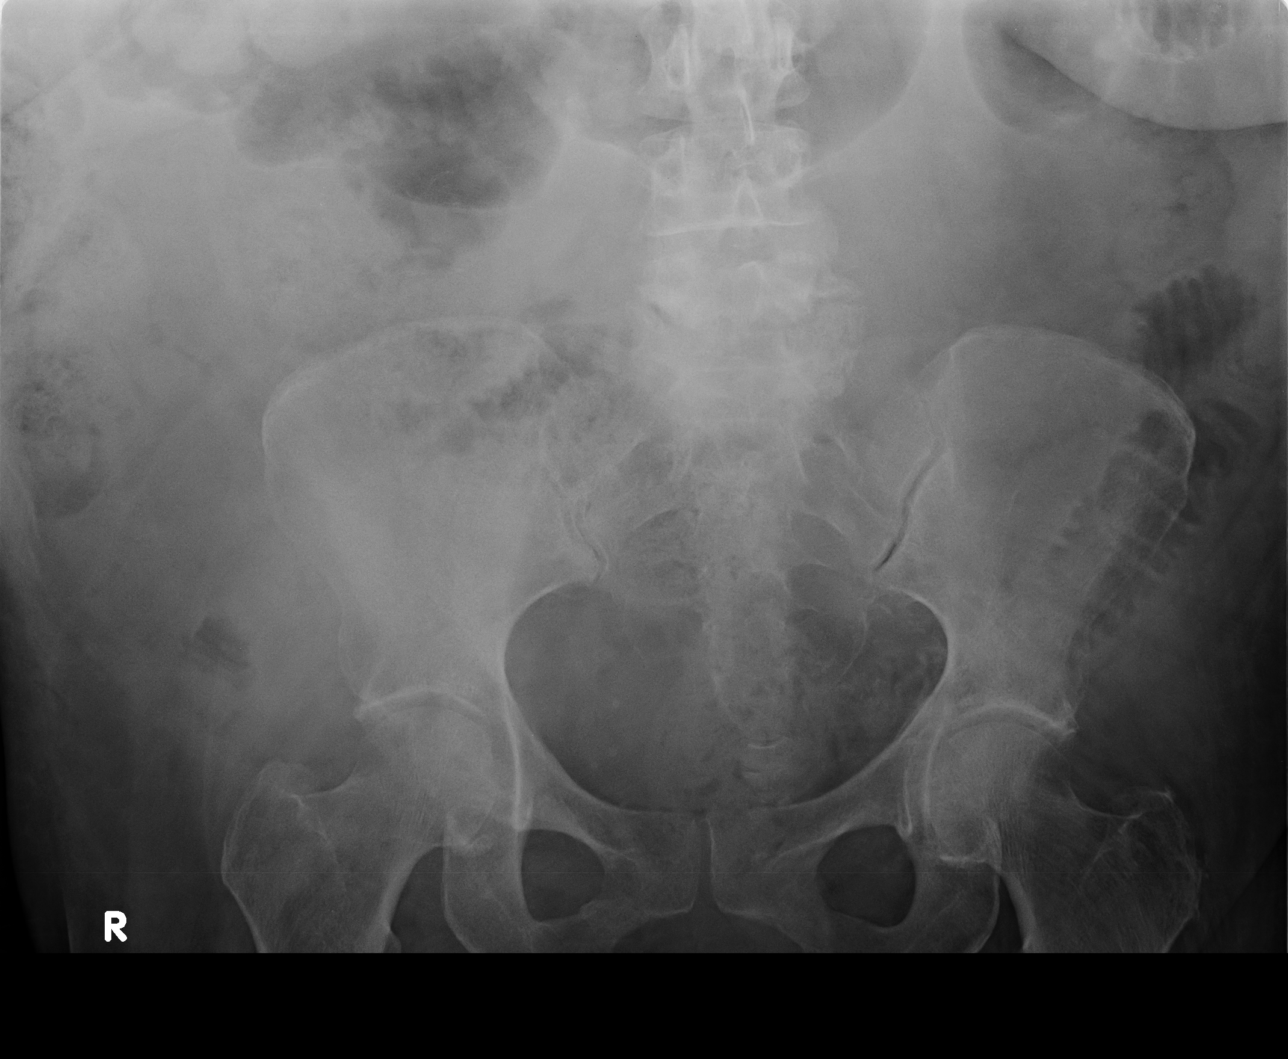

[4 of 4 positions shown; findings below may reference images not displayed]

EXAM
Complete abdominal series

INDICATION
follow bowel dilitation
DILATED BOWEL. HAS NG TUBE. H/O CHOLE, GASTRIC SLEEVE, COLON RESECTION, HYSTER.

TECHNIQUE
Single PA view chest with upright and supine views abdomen

COMPARISONS
[Single-view abdomen November 12, 2018]

FINDINGS
There is no focal consolidation, effusion or pneumothorax. The cardiac silhouette is within normal
limits. The bony thorax is intact. Satisfactory position of nasogastric tube.
There is again seen multiple dilated air and fluid-filled loops of colon and small bowel. There is
a mild amount of intracolonic fecal distention. There is no evidence of free air. There is no
pathologic calcification or acute osseous abnormality.

IMPRESSION
1. No acute cardiopulmonary abnormality.
2. Again seen multiple dilated air and fluid-filled loops of colon and small bowel similar to
previous exam. Satisfactory position of nasogastric tube.

Tech Notes:

DILATED BOWEL. HAS NG TUBE. H/O CHOLE, GASTRIC SLEEVE, COLON RESECTION, HYSTER.

## 2020-11-17 ENCOUNTER — Encounter: Admit: 2020-11-17 | Discharge: 2020-11-17 | Payer: MEDICARE

## 2020-12-01 ENCOUNTER — Encounter: Admit: 2020-12-01 | Discharge: 2020-12-01 | Payer: MEDICARE

## 2020-12-01 DIAGNOSIS — K7460 Unspecified cirrhosis of liver: Secondary | ICD-10-CM

## 2020-12-01 LAB — COMPREHENSIVE METABOLIC PANEL
ALK PHOSPHATASE: 147 — ABNORMAL HIGH
ALT: 73 — ABNORMAL HIGH
ANION GAP: 9 %
AST: 54 — ABNORMAL HIGH
BLD UREA NITROGEN: 18 %
CHLORIDE: 99 — ABNORMAL LOW
CO2: 32 % — ABNORMAL HIGH
CREATININE: 1.5 — ABNORMAL HIGH
GFR ESTIMATED AFRICAN AMERICAN: 45 — ABNORMAL LOW
GFR ESTIMATED: 39 — ABNORMAL LOW
GLUCOSE,RANDOM: 294 % — ABNORMAL HIGH
POTASSIUM: 4.4
SODIUM: 140
TOTAL BILIRUBIN: 0.4
TOTAL PROTEIN: 6.1

## 2020-12-01 LAB — CBC AND DIFF
ABSOLUTE BASO COUNT: 0
WBC COUNT: 9.8

## 2020-12-01 LAB — PROTIME INR (PT): PROTIME: 11

## 2020-12-01 LAB — ALPHA FETO PROTEIN (AFP): AFP: 1.5 — ABNORMAL LOW

## 2020-12-07 ENCOUNTER — Encounter: Admit: 2020-12-07 | Discharge: 2020-12-07 | Payer: MEDICARE

## 2020-12-19 ENCOUNTER — Encounter: Admit: 2020-12-19 | Discharge: 2020-12-19 | Payer: MEDICARE

## 2020-12-19 ENCOUNTER — Ambulatory Visit: Admit: 2020-12-19 | Discharge: 2020-12-20 | Payer: MEDICARE

## 2020-12-19 DIAGNOSIS — J45909 Unspecified asthma, uncomplicated: Secondary | ICD-10-CM

## 2020-12-19 DIAGNOSIS — K7460 Unspecified cirrhosis of liver: Secondary | ICD-10-CM

## 2020-12-19 DIAGNOSIS — K7469 Other cirrhosis of liver: Secondary | ICD-10-CM

## 2020-12-19 DIAGNOSIS — K7581 Nonalcoholic steatohepatitis (NASH): Secondary | ICD-10-CM

## 2020-12-19 DIAGNOSIS — G43909 Migraine, unspecified, not intractable, without status migrainosus: Secondary | ICD-10-CM

## 2020-12-19 DIAGNOSIS — F99 Mental disorder, not otherwise specified: Secondary | ICD-10-CM

## 2020-12-19 DIAGNOSIS — E079 Disorder of thyroid, unspecified: Secondary | ICD-10-CM

## 2020-12-19 DIAGNOSIS — Z0000 Encounter for general adult medical examination without abnormal findings: Secondary | ICD-10-CM

## 2020-12-22 ENCOUNTER — Encounter: Admit: 2020-12-22 | Discharge: 2020-12-22 | Payer: MEDICARE

## 2020-12-22 NOTE — Progress Notes
Faxed [place] Abd complete to [facility] [phone]. Confirmation received. Imaging due June

## 2021-01-02 IMAGING — US ABDCM
1 series · 13 of 25 positions shown · non-contrast
Comparison: none

[Series 1: us abdomen complete · 13 of 96 slices shown]
[im 1/96]
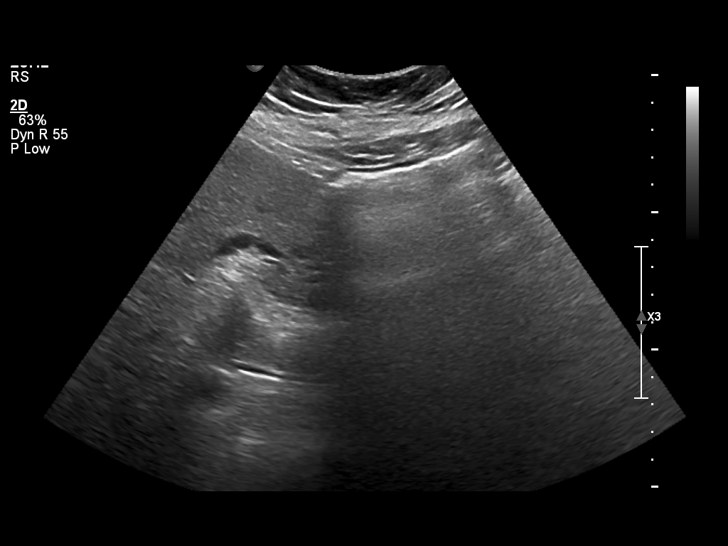
[im 8/96]
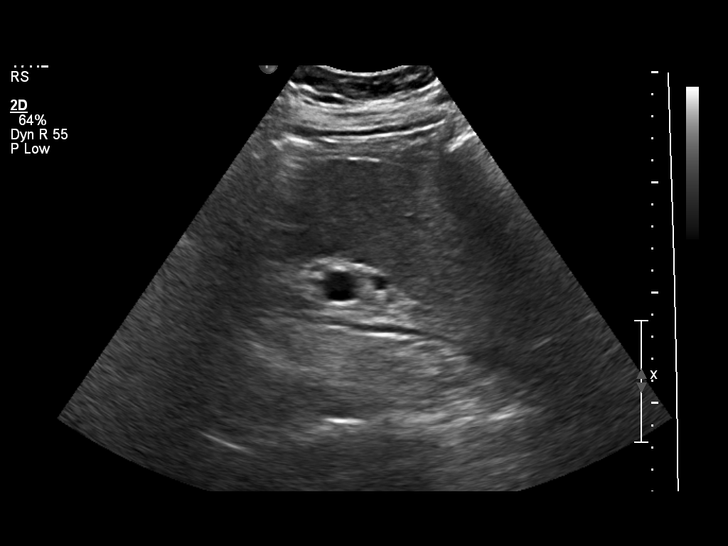
[im 16/96]
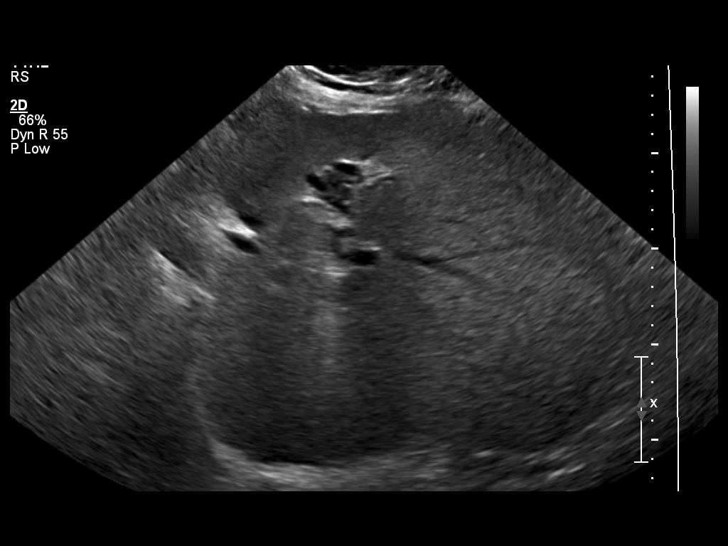
[im 24/96]
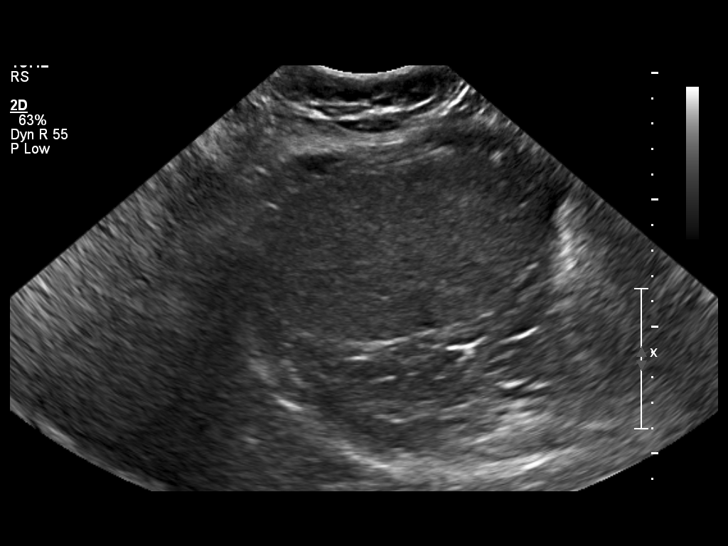
[im 32/96]
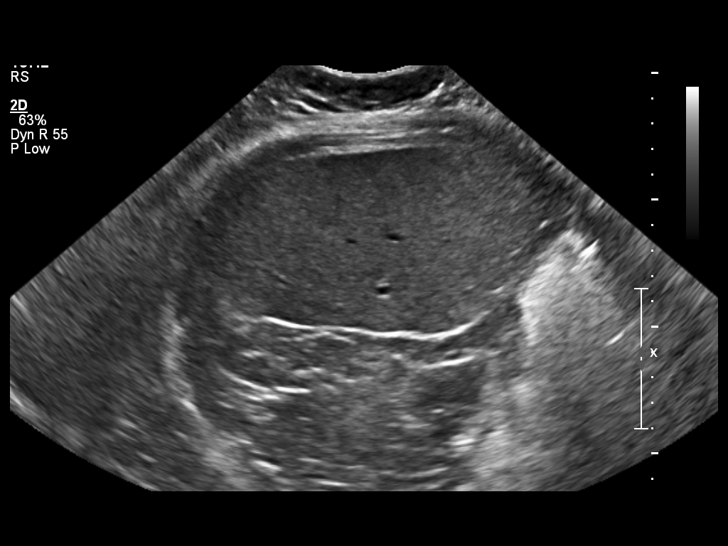
[im 40/96]
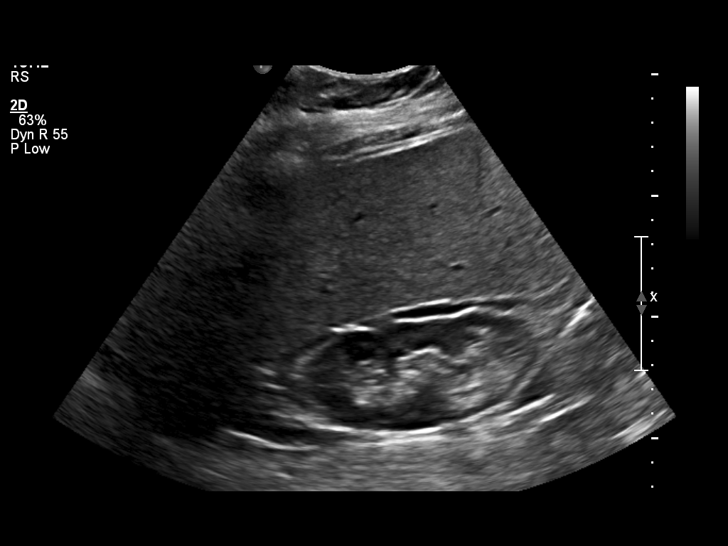
[im 48/96]
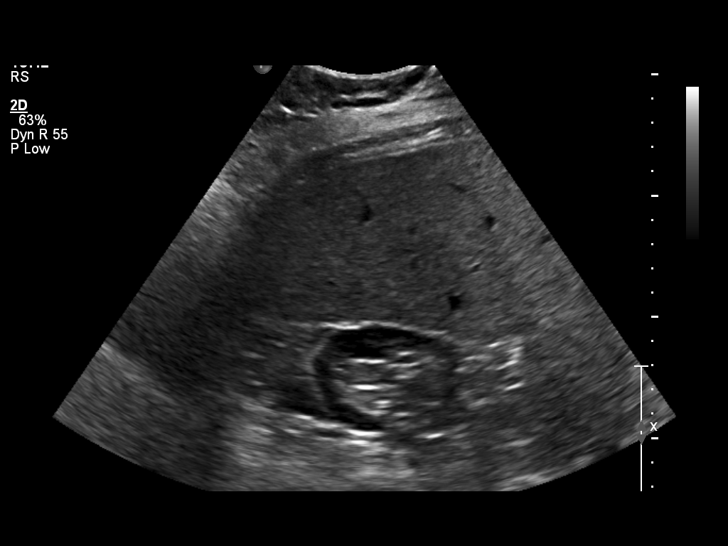
[im 56/96]
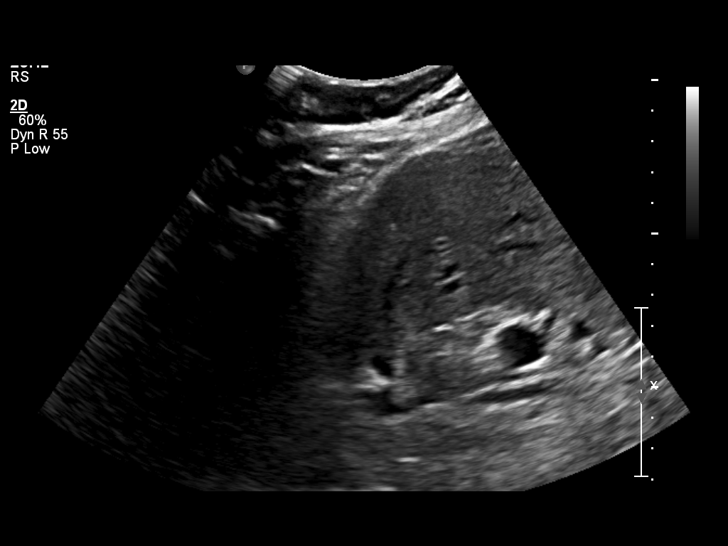
[im 64/96]
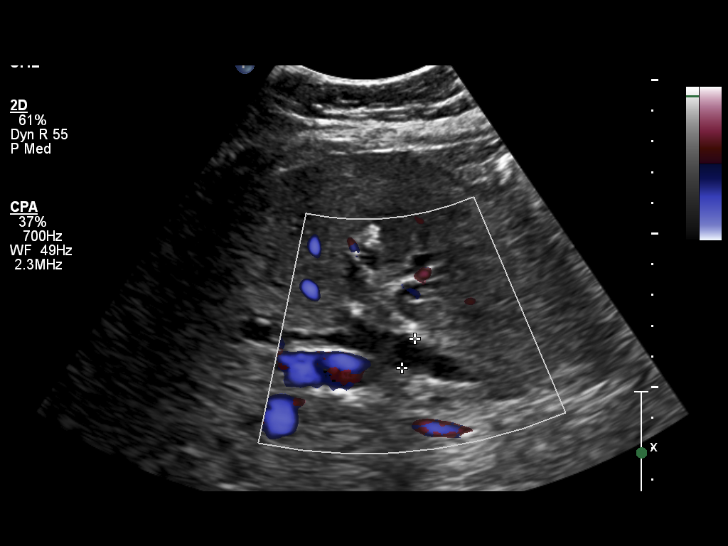
[im 72/96]
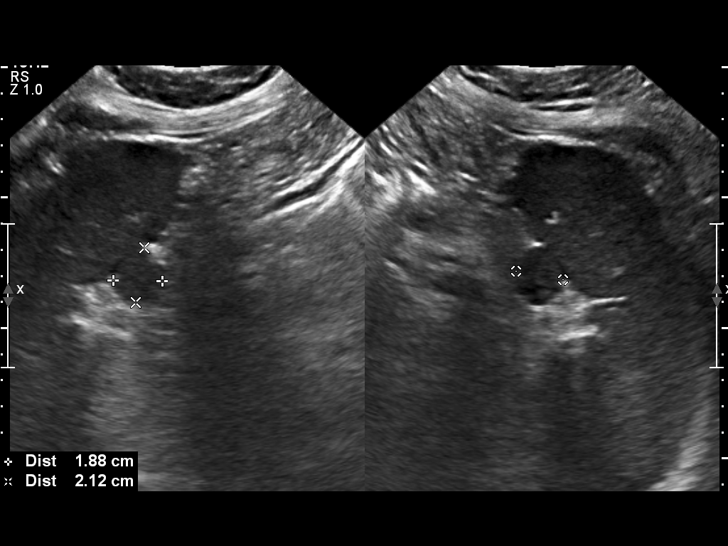
[im 80/96]
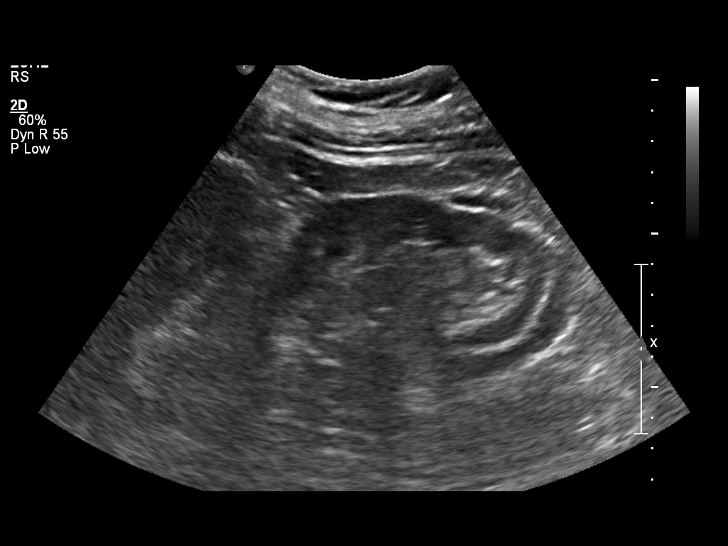
[im 88/96]
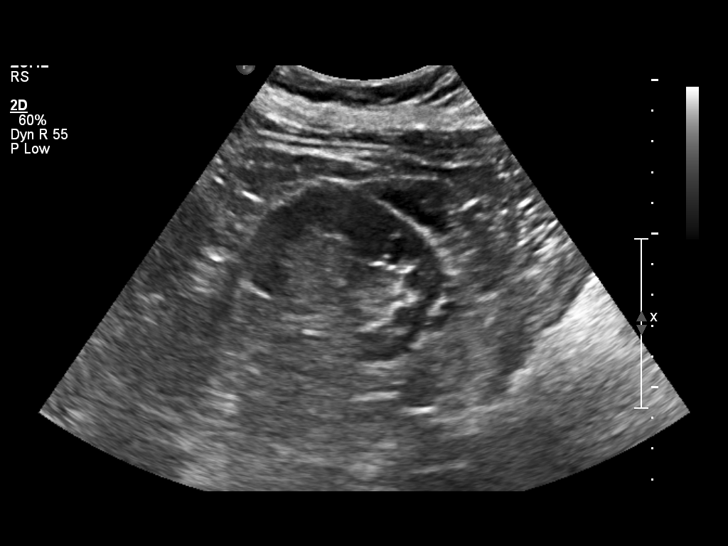
[im 96/96]
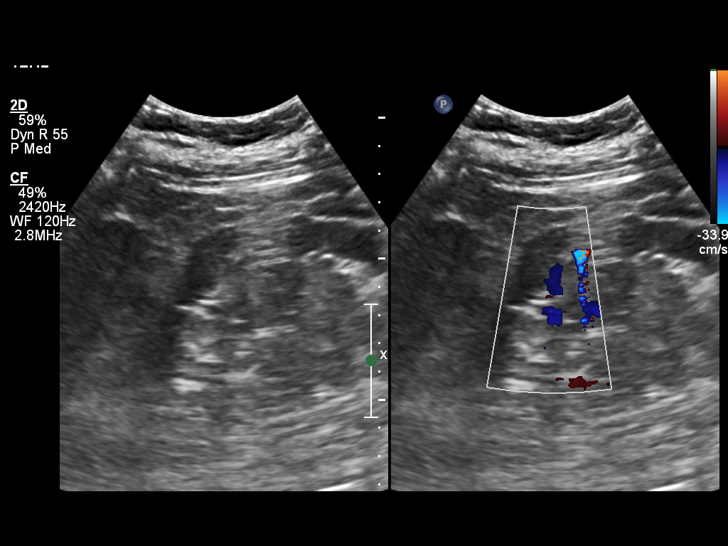

[13 of 25 positions shown; findings below may reference images not displayed]

EXAM

US ABDOMEN COMPLETE

INDICATION

Liver cirrhosis

COMPARISONS

CT abdomen pelvis 11/11/2018

FINDINGS

The liver measures 22.1  cm, normal . There is increased echogenicity. No discrete nodular surface
contour.

The common bile duct measures 1.03  cm. The gallbladder wall is surgically absent.

Limited evaluation of the pancreas secondary to overlying bowel gas and body habitus.

The right kidney measures 10.7  x 4.7  x 4.5  cm, increased echogenicity.

The left kidney measures 11.4  x 5.7  x 5.1  cm.  Increased echogenicity. Small possible
calcification located within the left kidney.

The spleen measures 14.6  cm in greatest length, within normal limits.

The abdominal aorta is not well visualized secondary to overlying bowel gas. The inferior vena cava
is patent though not well visualized.

IMPRESSION
1. Hepatomegaly with increased echogenicity and coarsened echotexture, which is nonspecific and may
represent underlying hepatic steatosis and/or other hepatocellular disease. No discrete nodular
surface contour.
2. Increased echogenicity of the kidneys suggestive of underlying medical renal disease.
3. Status post prior cholecystectomy with mild intra and extrahepatic biliary dilatation.

Tech Notes:

LIVER CIRRHOSIS, CHOLECYSTECTOMY IN 8474, CT 11/11/2018

## 2021-02-14 ENCOUNTER — Encounter: Admit: 2021-02-14 | Discharge: 2021-02-14 | Payer: MEDICARE

## 2021-03-01 ENCOUNTER — Encounter: Admit: 2021-03-01 | Discharge: 2021-03-01 | Payer: MEDICARE

## 2021-03-08 ENCOUNTER — Encounter: Admit: 2021-03-08 | Discharge: 2021-03-08 | Payer: MEDICARE

## 2021-03-08 DIAGNOSIS — K7581 Nonalcoholic steatohepatitis (NASH): Secondary | ICD-10-CM

## 2021-03-08 DIAGNOSIS — Z0000 Encounter for general adult medical examination without abnormal findings: Secondary | ICD-10-CM

## 2021-03-08 DIAGNOSIS — K7469 Other cirrhosis of liver: Secondary | ICD-10-CM

## 2021-03-12 ENCOUNTER — Encounter: Admit: 2021-03-12 | Discharge: 2021-03-12 | Payer: MEDICARE

## 2021-03-12 DIAGNOSIS — K7460 Unspecified cirrhosis of liver: Secondary | ICD-10-CM

## 2021-03-12 DIAGNOSIS — K7581 Nonalcoholic steatohepatitis (NASH): Secondary | ICD-10-CM

## 2021-04-04 ENCOUNTER — Encounter: Admit: 2021-04-04 | Discharge: 2021-04-04 | Payer: MEDICARE

## 2021-04-04 DIAGNOSIS — K7460 Unspecified cirrhosis of liver: Secondary | ICD-10-CM

## 2021-04-04 DIAGNOSIS — K7581 Nonalcoholic steatohepatitis (NASH): Secondary | ICD-10-CM

## 2021-04-05 ENCOUNTER — Encounter: Admit: 2021-04-05 | Discharge: 2021-04-05 | Payer: MEDICARE

## 2021-04-05 DIAGNOSIS — E785 Hyperlipidemia, unspecified: Secondary | ICD-10-CM

## 2021-04-05 DIAGNOSIS — R739 Hyperglycemia, unspecified: Secondary | ICD-10-CM

## 2021-04-05 DIAGNOSIS — E039 Hypothyroidism, unspecified: Secondary | ICD-10-CM

## 2021-04-05 DIAGNOSIS — K7581 Nonalcoholic steatohepatitis (NASH): Secondary | ICD-10-CM

## 2021-04-05 LAB — LIPID PROFILE
CHOLESTEROL/HDL %: 3 g/dL (ref 32.0–36.0)
HDL: 62 pg — ABNORMAL HIGH (ref 26–34)
LDL: 94 K/UL (ref 150–400)
NON HDL CHOLESTEROL: 127 % — ABNORMAL HIGH (ref 11–15)

## 2021-04-05 LAB — TSH WITH FREE T4 REFLEX: TSH: 2.4 FL — ABNORMAL HIGH (ref 80–100)

## 2021-04-06 ENCOUNTER — Encounter: Admit: 2021-04-06 | Discharge: 2021-04-06 | Payer: MEDICARE

## 2021-04-06 NOTE — Telephone Encounter
Pt called in to verify appointment in December.

## 2021-06-19 ENCOUNTER — Ambulatory Visit: Admit: 2021-06-19 | Discharge: 2021-06-20 | Payer: MEDICARE

## 2021-06-19 ENCOUNTER — Encounter: Admit: 2021-06-19 | Discharge: 2021-06-19 | Payer: MEDICARE

## 2021-06-19 DIAGNOSIS — K7581 Nonalcoholic steatohepatitis (NASH): Secondary | ICD-10-CM

## 2021-06-19 DIAGNOSIS — K7460 Unspecified cirrhosis of liver: Secondary | ICD-10-CM

## 2021-06-19 DIAGNOSIS — E079 Disorder of thyroid, unspecified: Secondary | ICD-10-CM

## 2021-06-19 DIAGNOSIS — Z0000 Encounter for general adult medical examination without abnormal findings: Secondary | ICD-10-CM

## 2021-06-19 DIAGNOSIS — F99 Mental disorder, not otherwise specified: Secondary | ICD-10-CM

## 2021-06-19 DIAGNOSIS — G43909 Migraine, unspecified, not intractable, without status migrainosus: Secondary | ICD-10-CM

## 2021-06-19 DIAGNOSIS — J45909 Unspecified asthma, uncomplicated: Secondary | ICD-10-CM

## 2021-06-19 NOTE — Progress Notes
The patient  reviewed the three documents sent via MyChart, agrees with the information and provided verbal consent for this visit.

## 2021-06-19 NOTE — Progress Notes
Date of Service: 06/19/2021    [facility] is a 55 y.o. female.    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 She presents as a telehealth visit today for routine follow up.   ?  [name]? is doing okay today.  She reports a recent hospitalization at an outside facility for volume overload in the setting of CHF.  Her weight reached a high of 330 pounds.  She is now back to around 300 pounds.  Diuretics are managed by her cardiologist and include metolazone and torsemide. She has not required recent paracentesis.  ?  [name] denies any recent issues with hepatic encephalopathy. ?She does report more depressive symptoms lately, is receiving treatment for this.  ?  She believes her most recent hemoglobin A1c was in the sevens.  She did get a continuous glucose monitor which has helped her achieve improved control.  ?  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  ?  [name]?lives with her husband.     [facility] endorses no other worrisome health concerns at today's visit.       Review of Systems: A 10 point review systems is performed and with the exceptions noted in the HPI are otherwise unremarkable.     Objective:         ? acetaminophen (TYLENOL) 325 mg tablet Take 650 mg by mouth every 4 hours as needed for Pain.   ? acyclovir (ZOVIRAX) 400 mg tablet Take 400 mg by mouth every 12 hours.   ? ADVAIR DISKUS 250-50 mcg/dose inhalation disk Inhale 1 puff by mouth into the lungs twice daily.   ? ALBUTEROL SULFATE (BULK) MISC Use  as directed every 6 hours as needed.   ? albuterol sulfate (PROAIR HFA) 90 mcg/actuation HFA aerosol inhaler Inhale 2 puffs by mouth into the lungs every 6 hours as needed.   ? ARIPiprazole (ABILIFY) 10 mg tablet Take 20 mg by mouth daily.   ? azelastine-fluticasone (DYMISTA) 137-50 mcg/spray nasal spray Apply  to each nostril as directed twice daily.   ? B2/magnesium cit,oxid/feverfew (MIGRELIEF PO) Take  by mouth twice daily.   ? baclofen (LIORESAL) 10 mg tablet Take 10 mg by mouth three times daily.   ? betamethasone dipropionate (DIPROLENE) 0.05 % topical cream Apply  topically to affected area twice daily.   ? buPROPion HCL SR (WELLBUTRIN SR) 150 mg tablet, 12 hr sustained-release Take 450 mg by mouth daily.   ? calcium carbonate (TUMS PO) Take 1,000 mg by mouth daily.   ? cetirizine HCl (ZYRTEC PO) Take 10 mg by mouth daily.   ? diphenhydrAMINE hcl (BENADRYL ALLERGY) 25 mg tablet Take 25 mg by mouth every 6 hours as needed.   ? ergocalciferol (vitamin D2) (DRISDOL) 1,250 mcg (50,000 unit) capsule Take one capsule by mouth three times weekly for 90 days.   ? escitalopram oxalate (LEXAPRO) 20 mg tablet Take 30 mg by mouth  daily. 1.5 tablet by mouth   ? FIBER CHOICE PO Take  by mouth daily. 2 gummies   ? fluticasone propionate (FLONASE) 50 mcg/actuation nasal spray, suspension Apply 2 sprays to each nostril as directed twice daily. Shake bottle gently before using.   ? gabapentin (NEURONTIN) 600 mg tablet Take 1,200 mg by mouth three times daily.   ? HUMALOG KWIKPEN INSULIN 100 unit/mL injection PEN Inject 40 Units under the skin three times daily with meals.   ? HYDROcodone/acetaminophen (NORCO) 5/325 mg tablet Take 1 tablet by mouth every 6 hours as needed for Pain   ? hydroxyzine pamoate (VISTARIL PO) Take 50 mg by mouth every 4 hours.   ? indomethacin (INDOCIN) 25 mg capsule Take 50 mg by mouth three times daily. Take with food.   ? insulin glargine,hum.rec.anlog (LANTUS SC) Inject 60 Units under the skin at bedtime daily.   ? L. acidophilus-L. rhamnosus (PROBIOTIC) 15 billion cell cap Take 1 capsule by mouth daily.   ? lactulose (GENERLAC) 10 gram/15 mL oral solution Take 30 mL by mouth twice daily. Titrate to 3-4 BMs/day (Patient taking differently: Take 10 g by mouth twice daily. Titrate to 3-4 BMs/day)   ? LEVEMIR FLEXTOUCH U-100 INSULN 100 unit/mL (3 mL) injection pen    ? levothyroxine (SYNTHROID) 175 mcg tablet Take 175 mcg by mouth daily 30 minutes before breakfast.   ? lipase/protease/amylase (ZENPEP PO) Take 40,000 Units by mouth three times daily. 4 capsules with meals   ? meclizine (ANTIVERT) 25 mg tablet Take one tablet by mouth twice daily as needed.   ? MULTIVITAMIN PO Take 3 capsules by mouth daily with breakfast. give separately from calcium citrate doses.   ? ondansetron (ZOFRAN ODT) 4 mg rapid dissolve tablet Dissolve one tablet by mouth every 8 hours as needed for Nausea or Vomiting. Place on tongue to disolve. (Patient taking differently: Dissolve 8 mg by mouth every 8 hours as needed for Nausea or Vomiting. Place on tongue to disolve.)   ? pantoprazole DR (PROTONIX) 40 mg tablet Take 40 mg by mouth daily.   ? polyethylene glycol 3350 (MIRALAX) 17 gram/dose powder Take 17 g by mouth as Needed.   ? potassium chloride SR (K-DUR) 20 mEq tablet Take 1 tablet by mouth every 7 days. Take with a meal and a full glass of water.   ? prazosin (MINIPRESS) 2 mg capsule Take 4 mg by mouth at bedtime daily.   ? prochlorperazine (COMPAZINE) 5 mg tablet Take one tablet by mouth every 6 hours as needed for Nausea. And severe migraine. Use no more than 3 per day max of 12 tablets per month.   ? promethazine (PHENERGAN) 25 mg tablet Take 25 mg by mouth every 6 hours as needed.   ? pyridoxine (vitamin B6) 50 mg cap Take 100 mg by mouth daily.   ? simethicone (GAS-X PO) Take  by mouth three times daily.   ? sodium chloride (SEA MIST) 0.65 % nasal spray Apply 1-2 sprays to each nostril as directed as Needed.   ? TORSEMIDE PO Take 60 mg by mouth daily.   ? traZODone (DESYREL) 300 mg tablet Take 300 mg by mouth at bedtime as needed.   ? verapamil HCl (VERELAN PO) Take 100 mg by mouth at bedtime daily.      Telehealth Patient Reported Vitals     Row Name 06/19/21 1523                BP: 132/80  Pulse: 69        Weight: 136.3 kg (300 lb 8 oz)        Height: 157.5 cm (5' 2)        Pain Score: EIGHT        Pain Location: BUTTOCKS                  Telehealth Body Mass Index: 54.96 at 06/19/2021  3:31 PM     Limited Observational Exam?  Constitutional: She appears well-nourished. No distress.?Very pleasant.?  Pulmonary/Chest: Effort normal?  Neurological: She is alert and oriented to person, place, and time.   Skin: No?obvious?jaundice. Normal color for race       Assessment and Plan:  1. ?Cirrhosis secondary to NASH:?MELD has been low, 10. No recent issues with ascites requiring paracentesis or hepatic encephalopathy.?  She is recovering from a prolonged hospitalization for CHF.   ?  Patient's current disease state does not warrant transplant consideration.?We will continue with a conservative plan of care including routine clinic follow-up, symptom management, and ensuring that she?stays up-to-date on recommended screening tests.  ?  Diabetes is under control. Previous baseline weights have been around 280 pounds.   ?  2. ?Screening for hepatocellular carcinoma:?Up to date. Repeat imaging due in Feb-- will arrange at [facility] per her request.?AFP obtained in August and WNL  ?  3. ?Screening for esophageal varices:?Up-to-date.?Ms. Pinkard?had an EGD?at an outside facility?in?April 2021?and no esophageal varices noted.   ?  4. Edema:?.Diuretics are managed by her local cardiologist-- Torsemide and once weekly metolazone. She should continue to limit dietary sodium intake to 2g/day.?Increase physical activity as tolerated.?  ??  5.?Healthcare maintenance: Continue with regular follow up with PCP.?  ?    At the end of the visit, [facility] had all of her questions answered. She has our contact information and is encouraged to call with any new questions or concerns.  I would like to see the patient back for follow up in 6 months

## 2021-06-26 ENCOUNTER — Encounter: Admit: 2021-06-26 | Discharge: 2021-06-26 | Payer: MEDICARE

## 2021-08-10 ENCOUNTER — Encounter: Admit: 2021-08-10 | Discharge: 2021-08-10 | Payer: MEDICARE

## 2021-08-10 DIAGNOSIS — K7581 Nonalcoholic steatohepatitis (NASH): Secondary | ICD-10-CM

## 2021-08-10 DIAGNOSIS — Z0000 Encounter for general adult medical examination without abnormal findings: Secondary | ICD-10-CM

## 2021-08-16 ENCOUNTER — Encounter: Admit: 2021-08-16 | Discharge: 2021-08-16 | Payer: MEDICARE

## 2021-08-24 ENCOUNTER — Encounter: Admit: 2021-08-24 | Discharge: 2021-08-24 | Payer: MEDICARE

## 2021-08-24 DIAGNOSIS — K7581 Nonalcoholic steatohepatitis (NASH): Secondary | ICD-10-CM

## 2021-08-24 DIAGNOSIS — K7460 Unspecified cirrhosis of liver: Secondary | ICD-10-CM

## 2021-08-24 DIAGNOSIS — Z0000 Encounter for general adult medical examination without abnormal findings: Secondary | ICD-10-CM

## 2021-09-12 ENCOUNTER — Encounter: Admit: 2021-09-12 | Discharge: 2021-09-12 | Payer: MEDICARE

## 2021-09-12 NOTE — Telephone Encounter
LVM reminding pt abdominal imaging and complete liver labs are due in February. [place] faxed to [facility]. Lab orders previously mailed to home. Request pt call scheduling department. Number provided.

## 2021-09-12 NOTE — Telephone Encounter
Spoke with spouse, pt currently inpatient at [facility] due to broken ankle and stomach issues. This nurse will request records.

## 2021-09-12 NOTE — Telephone Encounter
Sam, spouse of pt calling returning a call of getting imaging done. He was asking it can r/s or the orders sent somewhere else. Please call them back.

## 2021-10-03 ENCOUNTER — Encounter: Admit: 2021-10-03 | Discharge: 2021-10-03 | Payer: MEDICARE

## 2021-10-10 ENCOUNTER — Encounter: Admit: 2021-10-10 | Discharge: 2021-10-10 | Payer: MEDICARE

## 2021-10-15 ENCOUNTER — Encounter: Admit: 2021-10-15 | Discharge: 2021-10-15 | Payer: MEDICARE

## 2021-10-24 ENCOUNTER — Encounter: Admit: 2021-10-24 | Discharge: 2021-10-24 | Payer: MEDICARE

## 2021-10-25 ENCOUNTER — Encounter: Admit: 2021-10-25 | Discharge: 2021-10-25 | Payer: MEDICARE

## 2021-11-01 ENCOUNTER — Encounter: Admit: 2021-11-01 | Discharge: 2021-11-01 | Payer: MEDICARE

## 2021-11-13 ENCOUNTER — Encounter: Admit: 2021-11-13 | Discharge: 2021-11-13 | Payer: MEDICARE

## 2021-12-12 ENCOUNTER — Encounter: Admit: 2021-12-12 | Discharge: 2021-12-12 | Payer: MEDICARE

## 2021-12-12 NOTE — Telephone Encounter
Received text via [facility] to cancel appt as [facility] is admitted [facility]. Appt Cancelled

## 2022-02-12 ENCOUNTER — Encounter: Admit: 2022-02-12 | Discharge: 2022-02-12 | Payer: MEDICARE

## 2022-03-20 ENCOUNTER — Encounter: Admit: 2022-03-20 | Discharge: 2022-03-20 | Payer: MEDICARE

## 2022-04-16 ENCOUNTER — Encounter: Admit: 2022-04-16 | Discharge: 2022-04-16 | Payer: MEDICARE

## 2022-05-16 ENCOUNTER — Encounter: Admit: 2022-05-16 | Discharge: 2022-05-16 | Payer: MEDICARE

## 2022-06-05 DEATH — deceased

## 2022-06-19 ENCOUNTER — Encounter: Admit: 2022-06-19 | Discharge: 2022-06-19 | Payer: MEDICARE

## 2022-06-19 NOTE — Progress Notes
POST-MORTEM DOCUMENTATION    Name: [facility]   MRN: [id number]     DOB: 1966/01/21      Age: 56 y.o.  Admission Date: (Not on file)     LOS: 0 days     Date of Service: Jun 30, 2022      Date of death if known:  June 08, 2022  Location of death, if known: unknown  How were you notified?  mychart  Who notified us of death? Husband, Sam    Was hospice involved? Unknown  Name of hospice involved, if known: N/A  Date of hospice admission, if known: N/A    Other information:

## 2023-01-31 ENCOUNTER — Encounter: Admit: 2023-01-31 | Discharge: 2023-01-31 | Payer: MEDICARE
